# Patient Record
Sex: Male | Born: 1955 | Race: White | Hispanic: No | Marital: Married | Smoking: Former smoker
Health system: Southern US, Community
[De-identification: ages and names within clinical notes are randomized; demographics above are authoritative.]

## PROBLEM LIST (undated history)

## (undated) DIAGNOSIS — I2510 Atherosclerotic heart disease of native coronary artery without angina pectoris: Secondary | ICD-10-CM

## (undated) DIAGNOSIS — I6523 Occlusion and stenosis of bilateral carotid arteries: Secondary | ICD-10-CM

## (undated) DIAGNOSIS — K219 Gastro-esophageal reflux disease without esophagitis: Secondary | ICD-10-CM

## (undated) DIAGNOSIS — I6529 Occlusion and stenosis of unspecified carotid artery: Secondary | ICD-10-CM

## (undated) DIAGNOSIS — I495 Sick sinus syndrome: Secondary | ICD-10-CM

## (undated) DIAGNOSIS — K269 Duodenal ulcer, unspecified as acute or chronic, without hemorrhage or perforation: Secondary | ICD-10-CM

## (undated) DIAGNOSIS — I10 Essential (primary) hypertension: Secondary | ICD-10-CM

---

## 2011-12-06 DIAGNOSIS — K750 Abscess of liver: Secondary | ICD-10-CM | POA: Insufficient documentation

## 2012-03-21 DIAGNOSIS — K219 Gastro-esophageal reflux disease without esophagitis: Secondary | ICD-10-CM

## 2012-03-21 DIAGNOSIS — E119 Type 2 diabetes mellitus without complications: Secondary | ICD-10-CM | POA: Insufficient documentation

## 2012-03-21 DIAGNOSIS — I2510 Atherosclerotic heart disease of native coronary artery without angina pectoris: Secondary | ICD-10-CM

## 2012-03-21 DIAGNOSIS — I10 Essential (primary) hypertension: Secondary | ICD-10-CM

## 2012-03-21 HISTORY — DX: Essential (primary) hypertension: I10

## 2012-03-21 HISTORY — DX: Gastro-esophageal reflux disease without esophagitis: K21.9

## 2012-03-21 HISTORY — DX: Atherosclerotic heart disease of native coronary artery without angina pectoris: I25.10

## 2014-08-02 DIAGNOSIS — K269 Duodenal ulcer, unspecified as acute or chronic, without hemorrhage or perforation: Secondary | ICD-10-CM

## 2014-08-02 HISTORY — DX: Duodenal ulcer, unspecified as acute or chronic, without hemorrhage or perforation: K26.9

## 2015-04-09 DIAGNOSIS — E785 Hyperlipidemia, unspecified: Secondary | ICD-10-CM | POA: Insufficient documentation

## 2015-05-02 DIAGNOSIS — I6529 Occlusion and stenosis of unspecified carotid artery: Secondary | ICD-10-CM

## 2015-05-02 DIAGNOSIS — E785 Hyperlipidemia, unspecified: Secondary | ICD-10-CM | POA: Insufficient documentation

## 2015-05-02 DIAGNOSIS — E119 Type 2 diabetes mellitus without complications: Secondary | ICD-10-CM

## 2015-05-02 HISTORY — DX: Occlusion and stenosis of unspecified carotid artery: I65.29

## 2015-05-22 DIAGNOSIS — I495 Sick sinus syndrome: Secondary | ICD-10-CM

## 2015-05-22 HISTORY — DX: Sick sinus syndrome: I49.5

## 2015-12-30 DIAGNOSIS — I6523 Occlusion and stenosis of bilateral carotid arteries: Secondary | ICD-10-CM

## 2015-12-30 HISTORY — DX: Occlusion and stenosis of bilateral carotid arteries: I65.23

## 2018-03-09 DIAGNOSIS — Z45018 Encounter for adjustment and management of other part of cardiac pacemaker: Secondary | ICD-10-CM

## 2018-07-30 ENCOUNTER — Other Ambulatory Visit: Payer: Self-pay

## 2018-07-30 ENCOUNTER — Encounter (HOSPITAL_COMMUNITY): Payer: Self-pay | Admitting: Internal Medicine

## 2018-07-30 ENCOUNTER — Inpatient Hospital Stay (HOSPITAL_COMMUNITY)
Admission: AD | Admit: 2018-07-30 | Discharge: 2018-08-03 | DRG: 064 | Disposition: A | Discharge status: Home Health | Payer: BLUE CROSS/BLUE SHIELD | Source: Other Acute Inpatient Hospital | Attending: Family Medicine | Admitting: Family Medicine

## 2018-07-30 ENCOUNTER — Inpatient Hospital Stay (HOSPITAL_COMMUNITY): Payer: BLUE CROSS/BLUE SHIELD

## 2018-07-30 DIAGNOSIS — K922 Gastrointestinal hemorrhage, unspecified: Secondary | ICD-10-CM | POA: Diagnosis present

## 2018-07-30 DIAGNOSIS — Z794 Long term (current) use of insulin: Secondary | ICD-10-CM

## 2018-07-30 DIAGNOSIS — E785 Hyperlipidemia, unspecified: Secondary | ICD-10-CM | POA: Diagnosis present

## 2018-07-30 DIAGNOSIS — R29810 Facial weakness: Secondary | ICD-10-CM | POA: Diagnosis present

## 2018-07-30 DIAGNOSIS — I495 Sick sinus syndrome: Secondary | ICD-10-CM | POA: Diagnosis present

## 2018-07-30 DIAGNOSIS — K269 Duodenal ulcer, unspecified as acute or chronic, without hemorrhage or perforation: Secondary | ICD-10-CM | POA: Diagnosis not present

## 2018-07-30 DIAGNOSIS — Z6828 Body mass index (BMI) 28.0-28.9, adult: Secondary | ICD-10-CM | POA: Diagnosis not present

## 2018-07-30 DIAGNOSIS — D62 Acute posthemorrhagic anemia: Secondary | ICD-10-CM | POA: Diagnosis present

## 2018-07-30 DIAGNOSIS — Z885 Allergy status to narcotic agent status: Secondary | ICD-10-CM

## 2018-07-30 DIAGNOSIS — K2971 Gastritis, unspecified, with bleeding: Secondary | ICD-10-CM | POA: Diagnosis present

## 2018-07-30 DIAGNOSIS — I6523 Occlusion and stenosis of bilateral carotid arteries: Secondary | ICD-10-CM | POA: Diagnosis present

## 2018-07-30 DIAGNOSIS — Z955 Presence of coronary angioplasty implant and graft: Secondary | ICD-10-CM

## 2018-07-30 DIAGNOSIS — I639 Cerebral infarction, unspecified: Secondary | ICD-10-CM | POA: Diagnosis present

## 2018-07-30 DIAGNOSIS — I10 Essential (primary) hypertension: Secondary | ICD-10-CM | POA: Diagnosis present

## 2018-07-30 DIAGNOSIS — Z951 Presence of aortocoronary bypass graft: Secondary | ICD-10-CM

## 2018-07-30 DIAGNOSIS — Z79899 Other long term (current) drug therapy: Secondary | ICD-10-CM | POA: Diagnosis not present

## 2018-07-30 DIAGNOSIS — I6330 Cerebral infarction due to thrombosis of unspecified cerebral artery: Secondary | ICD-10-CM | POA: Diagnosis present

## 2018-07-30 DIAGNOSIS — E1140 Type 2 diabetes mellitus with diabetic neuropathy, unspecified: Secondary | ICD-10-CM | POA: Diagnosis present

## 2018-07-30 DIAGNOSIS — Z45018 Encounter for adjustment and management of other part of cardiac pacemaker: Secondary | ICD-10-CM

## 2018-07-30 DIAGNOSIS — E663 Overweight: Secondary | ICD-10-CM | POA: Diagnosis present

## 2018-07-30 DIAGNOSIS — K5900 Constipation, unspecified: Secondary | ICD-10-CM | POA: Diagnosis not present

## 2018-07-30 DIAGNOSIS — I2510 Atherosclerotic heart disease of native coronary artery without angina pectoris: Secondary | ICD-10-CM | POA: Diagnosis present

## 2018-07-30 DIAGNOSIS — K210 Gastro-esophageal reflux disease with esophagitis: Secondary | ICD-10-CM | POA: Diagnosis present

## 2018-07-30 DIAGNOSIS — G4700 Insomnia, unspecified: Secondary | ICD-10-CM | POA: Diagnosis present

## 2018-07-30 DIAGNOSIS — K264 Chronic or unspecified duodenal ulcer with hemorrhage: Secondary | ICD-10-CM | POA: Diagnosis present

## 2018-07-30 DIAGNOSIS — R55 Syncope and collapse: Secondary | ICD-10-CM | POA: Diagnosis not present

## 2018-07-30 DIAGNOSIS — Z7982 Long term (current) use of aspirin: Secondary | ICD-10-CM

## 2018-07-30 DIAGNOSIS — Z950 Presence of cardiac pacemaker: Secondary | ICD-10-CM | POA: Diagnosis not present

## 2018-07-30 DIAGNOSIS — E1169 Type 2 diabetes mellitus with other specified complication: Secondary | ICD-10-CM

## 2018-07-30 DIAGNOSIS — Z87891 Personal history of nicotine dependence: Secondary | ICD-10-CM | POA: Diagnosis not present

## 2018-07-30 DIAGNOSIS — E119 Type 2 diabetes mellitus without complications: Secondary | ICD-10-CM

## 2018-07-30 HISTORY — DX: Occlusion and stenosis of bilateral carotid arteries: I65.23

## 2018-07-30 HISTORY — DX: Essential (primary) hypertension: I10

## 2018-07-30 HISTORY — DX: Duodenal ulcer, unspecified as acute or chronic, without hemorrhage or perforation: K26.9

## 2018-07-30 HISTORY — DX: Atherosclerotic heart disease of native coronary artery without angina pectoris: I25.10

## 2018-07-30 HISTORY — DX: Gastro-esophageal reflux disease without esophagitis: K21.9

## 2018-07-30 HISTORY — DX: Occlusion and stenosis of unspecified carotid artery: I65.29

## 2018-07-30 HISTORY — DX: Sick sinus syndrome: I49.5

## 2018-07-30 LAB — COMPREHENSIVE METABOLIC PANEL
ALT: 12 U/L (ref 0–44)
AST: 15 U/L (ref 15–41)
Albumin: 2.7 g/dL — ABNORMAL LOW (ref 3.5–5.0)
Alkaline Phosphatase: 42 U/L (ref 38–126)
Anion gap: 13 (ref 5–15)
BUN: 14 mg/dL (ref 8–23)
CO2: 24 mmol/L (ref 22–32)
Calcium: 8.3 mg/dL — ABNORMAL LOW (ref 8.9–10.3)
Chloride: 98 mmol/L (ref 98–111)
Creatinine, Ser: 0.78 mg/dL (ref 0.61–1.24)
GFR calc Af Amer: >60 mL/min (ref >60)
GFR calc non Af Amer: >60 mL/min (ref >60)
Glucose, Bld: 226 mg/dL — ABNORMAL HIGH (ref 70–99)
Potassium: 4 mmol/L (ref 3.5–5.1)
Sodium: 135 mmol/L (ref 135–145)
Total Bilirubin: 1 mg/dL (ref 0.3–1.2)
Total Protein: 5.5 g/dL — ABNORMAL LOW (ref 6.5–8.1)

## 2018-07-30 LAB — BRAIN NATRIURETIC PEPTIDE: B Natriuretic Peptide: 109.4 pg/mL — ABNORMAL HIGH (ref 0.0–100.0)

## 2018-07-30 LAB — CBC WITH DIFFERENTIAL/PLATELET
Abs Immature Granulocytes: 0.23 10*3/uL — ABNORMAL HIGH (ref 0.00–0.07)
BASOS PCT: 0 %
Basophils Absolute: 0 10*3/uL (ref 0.0–0.1)
Eosinophils Absolute: 0.2 10*3/uL (ref 0.0–0.5)
Eosinophils Relative: 2 %
HCT: 24.2 % — ABNORMAL LOW (ref 39.0–52.0)
Hemoglobin: 7.3 g/dL — ABNORMAL LOW (ref 13.0–17.0)
Immature Granulocytes: 2 %
Lymphocytes Relative: 18 %
Lymphs Abs: 1.9 10*3/uL (ref 0.7–4.0)
MCH: 27 pg (ref 26.0–34.0)
MCHC: 30.2 g/dL (ref 30.0–36.0)
MCV: 89.6 fL (ref 80.0–100.0)
Monocytes Absolute: 1 10*3/uL (ref 0.1–1.0)
Monocytes Relative: 10 %
Neutro Abs: 7 10*3/uL (ref 1.7–7.7)
Neutrophils Relative %: 68 %
Platelets: 293 10*3/uL (ref 150–400)
RBC: 2.7 MIL/uL — ABNORMAL LOW (ref 4.22–5.81)
RDW: 13.8 % (ref 11.5–15.5)
WBC: 10.4 10*3/uL (ref 4.0–10.5)
nRBC: 0.3 % — ABNORMAL HIGH (ref 0.0–0.2)

## 2018-07-30 LAB — GLUCOSE, CAPILLARY
Glucose-Capillary: 210 mg/dL — ABNORMAL HIGH (ref 70–99)
Glucose-Capillary: 262 mg/dL — ABNORMAL HIGH (ref 70–99)

## 2018-07-30 LAB — MAGNESIUM: Magnesium: 1.7 mg/dL (ref 1.7–2.4)

## 2018-07-30 MED ORDER — SIMVASTATIN 20 MG PO TABS
40.0000 mg | ORAL_TABLET | Freq: Every day | ORAL | Status: DC
  Administered 2018-08-01 – 2018-08-03 (×3): 40 mg via ORAL
  Filled 2018-07-30 (×3): qty 2

## 2018-07-30 MED ORDER — INSULIN GLARGINE 100 UNIT/ML ~~LOC~~ SOLN
10.0000 [IU] | Freq: Every day | SUBCUTANEOUS | Status: DC
  Administered 2018-07-30 – 2018-08-01 (×3): 10 [IU] via SUBCUTANEOUS
  Filled 2018-07-30 (×4): qty 0.1

## 2018-07-30 MED ORDER — TEMAZEPAM 7.5 MG PO CAPS
15.0000 mg | ORAL_CAPSULE | Freq: Every evening | ORAL | Status: DC | PRN
  Administered 2018-07-30 – 2018-08-01 (×3): 15 mg via ORAL
  Filled 2018-07-30 (×3): qty 2

## 2018-07-30 MED ORDER — SODIUM CHLORIDE 0.9 % IV SOLN
8.0000 mg/h | INTRAVENOUS | Status: AC
  Administered 2018-07-30 – 2018-08-02 (×6): 8 mg/h via INTRAVENOUS
  Filled 2018-07-30 (×9): qty 80

## 2018-07-30 MED ORDER — PANTOPRAZOLE SODIUM 40 MG IV SOLR
40.0000 mg | Freq: Two times a day (BID) | INTRAVENOUS | Status: DC
  Administered 2018-08-03: 40 mg via INTRAVENOUS
  Filled 2018-07-30: qty 40

## 2018-07-30 MED ORDER — CLONIDINE HCL 0.1 MG PO TABS
0.1000 mg | ORAL_TABLET | Freq: Two times a day (BID) | ORAL | Status: DC
  Administered 2018-07-30 – 2018-08-03 (×8): 0.1 mg via ORAL
  Filled 2018-07-30 (×8): qty 1

## 2018-07-30 MED ORDER — INSULIN ASPART 100 UNIT/ML ~~LOC~~ SOLN
0.0000 [IU] | Freq: Three times a day (TID) | SUBCUTANEOUS | Status: DC
  Administered 2018-07-31: 3 [IU] via SUBCUTANEOUS
  Administered 2018-07-31: 2 [IU] via SUBCUTANEOUS
  Administered 2018-07-31 – 2018-08-01 (×2): 3 [IU] via SUBCUTANEOUS
  Administered 2018-08-01: 2 [IU] via SUBCUTANEOUS
  Administered 2018-08-02: 3 [IU] via SUBCUTANEOUS
  Administered 2018-08-02: 2 [IU] via SUBCUTANEOUS
  Administered 2018-08-02: 3 [IU] via SUBCUTANEOUS
  Administered 2018-08-03: 1 [IU] via SUBCUTANEOUS
  Administered 2018-08-03 (×2): 3 [IU] via SUBCUTANEOUS

## 2018-07-30 MED ORDER — GABAPENTIN 600 MG PO TABS
300.0000 mg | ORAL_TABLET | Freq: Every day | ORAL | Status: DC
  Administered 2018-07-30 – 2018-08-02 (×4): 300 mg via ORAL
  Filled 2018-07-30 (×4): qty 1

## 2018-07-30 NOTE — Care Management (Signed)
This is a no charge note  Pending admission per Dr.   Atlee [facility]fer [facility] per Dr. Ebbie LatusSikorski  62 year old male with past medical history of hypertension, GERD, CAD, CABG, pacemaker placement, duodenal ulcer, who presents with rectal bleeding and generalized weakness.  Patient had 2 syncope episodes in the past several days.  Found to have hemoglobin dropped from 14.1 on 05/2017 to 7.7 today.  Patient is going to be transfused with 1 unit of blood.  Protonix drip is started.   Patient was found to have negative troponin, proBNP 435, blood pressure 200/86--> 162/72, heart rate 74, RR 19, oxygen saturation 96% on room air, no fever.  Chest x-ray negative.  CT head is negative for acute intracranial abnormalities.  Patient is accepted to telemetry bed as inpatient.  Since patient is hemodynamically stable, hemoglobin is 7.7, no active bleeding currently.  I did not request GI consult tonight.  Please call GI at admission.  I told EDP that although we are accepting the patient, yet we do not have bed available currently. Patient may not be able to get here in a timely fashion. I encouraged him to continue looking for a facility that had beds.   Please call manager of [facility] at [phone] when pt arrives to floor   Lorretta HarpXilin Kalief Kattner, MD  [facility] Pager [phone]  If 7PM-7AM, please contact night-coverage [email or web address] Password [email or web address] 07/30/2018, 1:41 AM

## 2018-07-30 NOTE — Progress Notes (Signed)
Dakota Morton [id number] Admission Data: 07/30/2018 7:40 PM Attending Provider: Ike BenePark, Carolyn J, MD  WUJ:[id number]:System, Pcp Not In Consults/ Treatment Team: Treatment Team:  Charna ElizabethMann, Jyothi, MD  Dakota Morton is a 62 y.o. male patient [facility], alert  & orientated  X 4,  Full Code, VSS - Blood pressure (!) 168/65, pulse 80, temperature 99.5 F (37.5 C), temperature source Oral, resp. rate 18, height 5\' 9"  (1.753 m), weight 86 kg, SpO2 98 %., O2  RA, no c/o shortness of breath, no c/o chest pain, no distress noted. Tele # 14 placed and pt is currently running: NSR   IV site WDL:  LAC and RFA with a transparent dsg that's clean dry and intact.  Allergies:   Allergies  Allergen Reactions  . Codeine Nausea Only     Pt orientation to unit, room and routine. Information packet given to patient/family and safety video watched.  Admission INP armband ID verified with patient/family, and in place. SR up x 2, fall risk assessment complete with Patient and family verbalizing understanding of risks associated with falls. Pt verbalizes an understanding of how to use the call bell and to call for help before getting out of bed.  Skin, clean-dry- intact without evidence of bruising, or skin tears.   No evidence of skin break down noted on exam.  Will cont to monitor and assist as needed.  Tacey HeapElisha Epperson, RN 07/30/2018 7:40 PM

## 2018-07-30 NOTE — H&P (Signed)
History and Physical  Dakota Morton Arvanitis [id number] DOB: May 06, 1956 DOA: 07/30/2018 1746  Referring physician: transfer from [facility] (accepted by Dr. Clyde LundborgNiu) PCP: System, Pcp Not In  Outpatient Specialists: Dr. Chales AbrahamsGupta (GI - [place])  HISTORY   Chief Complaint: tarry stools and syncope  HPI: Dakota Morton is a 62 y.o. male with CAD s/p CABG, hx of sick sinus syncrome s/p PPM, bilateral carotid artery stenosis s/p R carotid stent, HTN, and prior hx of duodenal ulcer s/p IR GDA embolization (2016) who presents with tarry stools and near syncope that started 3 days ago. Patient reports that on Friday (3 days prior to this admission), he had an episode of black stools x 2 (pasty and black-colored). Then later that day and early on Saturday, patient had one episode of near fainting while ambulating and then another episode of syncope where he was found slumped against the door by his wife. His wife also noted that immediately after he was found down, patient had a noticeable L sided lower facial droop. Patient also recalls having increased L hand grip clumsiness around the time of his syncope. Denies head trauma although fall was not witnessed -- thought that patient had slid against the doorframe. [facility] given his symptoms. Hb found to 7.7 at [facility] (baseline 13-14); patient was transfused 1 unit pRBC and started on PPI. [facility] anticipated GI procedure -- due to bed availability, arrived on 07/30/18 evening.    Patient denies recent NSAID use; only takes baby aspirin daily (as prescribed). No chest pain, URI symptoms, UTI symptoms, fevers/chills. Last BM was late Friday (black and pasty stools). Has not had a BM since Friday. He reports that for the past few months, he has noticed an occasional gnawing discomfort and bloating/gas like symptoms with associated nausea post-prandially in his upper epigastric area --  patient reports relief with alka-seltzers. Currently feels mildly nauseated.   Review of Systems:  + tarry stools; + nausea + near syncope and actual syncope and + transient L facial droop on Friday and L hand clumsiness (resolved)  - no fevers/chills - no cough - no chest pain, dyspnea on exertion - no edema, PND, orthopnea - no dysuria, increased urinary frequency - no weight changes  Rest of systems reviewed are negative, except as per above history.   ED course:  Vitals Blood pressure (!) 168/65, pulse 80, temperature 99.5 F (37.5 C), temperature source Oral, resp. rate 18, height 5\' 9"  (1.753 m), weight 86 kg, SpO2 98 %. Prior to transfer ([facility]) reported 1 pRBC x 1 unit and PPI 40mg  bolus and infusion started.   Past Medical History:  Diagnosis Date  . Atherosclerotic heart disease of native coronary artery without angina pectoris 03/21/2012   Stent 2003 Stent 2005 B TCA to right coronary artery 2003 as well as in April 2015  . Bilateral carotid artery stenosis 12/30/2015  . Carotid stenosis 05/02/2015  . Duodenal ulcer 2016  . Essential hypertension 03/21/2012  . GERD (gastroesophageal reflux disease) 03/21/2012  . SSS (sick sinus syndrome) (HCC) 05/22/2015   Surgical history: CABG, R carotid artery stent; duodenal ulcer s/p IR embolization (2016); Hernia surgery  Social History:  reports that he has quit smoking. He quit after 25.00 years of use. He has never used smokeless tobacco. He reports previous alcohol use. He reports that he does not use drugs.  Allergies  Allergen Reactions  . Codeine Nausea Only  No family history on file.    Prior to Admission medications   Medication Sig Start Date End Date Taking? Authorizing Provider  amLODipine (NORVASC) 10 MG tablet Take 10 mg by mouth. 06/19/18  Yes [provider]  aspirin EC 81 MG tablet Take 81 mg by mouth daily.   Yes [provider]  benazepril (LOTENSIN) 40 MG tablet Take 40 mg by  mouth. 06/19/18  Yes [provider]  fenofibrate 160 MG tablet Take by mouth. 04/01/17  Yes [provider]  furosemide (LASIX) 20 MG tablet Take by mouth. 04/24/18  Yes [provider]  insulin NPH Human (HUMULIN N,NOVOLIN N) 100 UNIT/ML injection  03/24/17  Yes [provider]  metFORMIN (GLUCOPHAGE) 1000 MG tablet Take by mouth. 10/21/15  Yes [provider]  metoprolol tartrate (LOPRESSOR) 100 MG tablet Take 50 mg by mouth 2 (two) times daily. 02/14/18  Yes [provider]  simvastatin (ZOCOR) 40 MG tablet Take by mouth. 03/09/18  Yes [provider]  temazepam (RESTORIL) 15 MG capsule Take by mouth. 08/20/15  Yes [provider]  Gabapentin, Once-Daily, 300 MG TABS Take 300 mg by mouth daily.    [provider]  insulin detemir (LEVEMIR) 100 UNIT/ML injection Inject into the skin.    [provider]     PHYSICAL EXAM   Temp:  [99.5 F (37.5 C)] 99.5 F (37.5 C) (12/29 1813) Pulse Rate:  [80] 80 (12/29 1813) Resp:  [18] 18 (12/29 1813) BP: (168)/(65) 168/65 (12/29 1813) SpO2:  [98 %] 98 % (12/29 1813) Weight:  [86 kg] 86 kg (12/29 1813)  BP (!) 168/65 (BP Location: Left Arm)   Pulse 80   Temp 99.5 F (37.5 C) (Oral)   Resp 18   Ht 5\' 9"  (1.753 m)   Wt 86 kg   SpO2 98%   BMI 27.98 kg/m    GEN obese elderly caucasian male; not in acute distress; tearful during conversation; observed ambulating from bathroom without issue  HEENT NCAT EOM intact PERRL; clear oropharynx, no cervical LAD; moist mucus membranes  JVP estimated 5 cm H2O above RA; no HJR ; no carotid bruits b/l ;  CV regular normal rate; normal S1 and S2; no m/r/g or S3/S4; PMI non displaced; no parasternal heave;  Well-healed sternotomy scar; well healed PPM site L upper chest without hematoma RESP CTA b/l; breathing unlabored and symmetric  ABD soft NT ND +normoactive BS  EXT warm throughout b/l; no peripheral edema b/l  PULSES   DP 2+ intact b/l  SKIN/MSK no rashes or lesions  NEURO/PSYCH AAOx4;  no facial droop; intact 5/5 hand grip b/l; pupils equal and reactive; no visual field deficits; no sensory deficits; lower extremity motor strength symmetric and 5/5 b/l   DATA   LABS ON ADMISSION:  Basic Metabolic Panel: No results for input(s): NA, K, CL, CO2, GLUCOSE, BUN, CREATININE, CALCIUM, MG, PHOS in the last 168 hours. CBC: Recent Labs  Lab 07/30/18 1851  WBC 10.4  NEUTROABS 7.0  HGB 7.3*  HCT 24.2*  MCV 89.6  PLT 293   Liver Function Tests: No results for input(s): AST, ALT, ALKPHOS, BILITOT, PROT, ALBUMIN in the last 168 hours. No results for input(s): LIPASE, AMYLASE in the last 168 hours. No results for input(s): AMMONIA in the last 168 hours. Coagulation:  No results found for: INR, PROTIME No results found for: PTT Lactic Acid, Venous:  No results found for: LATICACIDVEN Cardiac Enzymes: No results for input(s): CKTOTAL,  CKMB, CKMBINDEX, TROPONINI in the last 168 hours. Urinalysis: No results found for: COLORURINE, APPEARANCEUR, LABSPEC, PHURINE, GLUCOSEU, HGBUR, BILIRUBINUR, KETONESUR, PROTEINUR, UROBILINOGEN, NITRITE, LEUKOCYTESUR  BNP (last 3 results) No results for input(s): PROBNP in the last 8760 hours. CBG: Recent Labs  Lab 07/30/18 1806  GLUCAP 210*    Radiological Exams on Admission: No results found.  EKG: Independently reviewed. NSR with nonspecific ST and T wave inversions in infero and lateral precordial leads  I have reviewed the patient's previous electronic chart records, labs, and other data.   ASSESSMENT AND PLAN   Assessment: Dakota Morton is a 62 y.o. male with CAD s/p CABG, hx of sick sinus syncrome s/p PPM, bilateral carotid artery stenosis s/p R carotid stent, HTN, and prior hx of duodenal ulcer s/p IR GDA embolization (2016) who presents with tarry stools and near syncope + orthostatic syncope x 3 days and [facility].  Symptoms concerning for UGIB. Per [facility] report, patient's Hb 7.7 (baseline at 13-14) and received pRBC x 1 unit, started on protonix drip. Given patient's subacute symptoms of postprandial nausea for past few months, he may have had a worsening ulcer prior to admission. No other triggers ie NSAID use; unknown H pylori history. Also concerning after his second syncope event that occurred about 2 days prior to admission is wife's report of transient facial droop and L hand clumsiness (resolved). Priority will be for upper GI bleed evaluation as per GI but we will also plan for CT head overnight, and consider further neuro workup after anticipated EGD.    Active Problems:   GI bleed  Plan:   # Suspected UGIB with prior hx of duodenal ulcer s/p GDA embolization by IR in 2016 (Care Everywhere IR report under Other Results). > [facility] s/p 1 pRBC; repeat Hb here 7.3; hemodynamically stable - type and screen; maintain 2 large bore PIVs - GI aware; will decide on possible scope either for 12/30 or 12/31 tentatively - repeat Hb pending in AM, unless patient has recurrent melena/tarry stool - continue protonix drip - transfuse goal Hb > 7  - prn fluid bolus as needed for hypotension - NPO after midnight - hold aspirin at this time  # Concern for TIA after second syncope. Known bilateral carotid artery disease with R carotid stent. No focal neuro symptoms at this time.  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 [facility] showed EF 55-60% with mild apical hypokinesis.  > no angina;  - EKG pending - holding aspirin given GI bleed - may resume statin post procedure  # Hx of diabetic neuropathy and insomnia  - resume home restoril as needed prn sleep  - gabapentin 300mg  nightly resume  DVT Prophylaxis: SCDs (no pharmacological given high bleeding risk) Code Status:  Full Code Family Communication: discussed with patient and family at bedside  Disposition Plan: admit to floor (medical telemetry) pending GI evaluation   Patient contact: Extended Emergency Contact Information Primary Emergency Contact: Jennifer, [place] Mobile Phone: [phone] Relation: None Interpreter needed? No Secondary Emergency Contact: Tomasz, [place] Mobile Phone: [phone] Relation: None Interpreter needed? No  Time spent: > 35 mins  Ike Bene, MD [facility] Pager [phone]  If 7PM-7AM, please contact night-coverage [email or web address] Password [facility] 07/30/2018, 7:49 PM

## 2018-07-31 DIAGNOSIS — K269 Duodenal ulcer, unspecified as acute or chronic, without hemorrhage or perforation: Secondary | ICD-10-CM

## 2018-07-31 DIAGNOSIS — D62 Acute posthemorrhagic anemia: Secondary | ICD-10-CM

## 2018-07-31 LAB — PREPARE RBC (CROSSMATCH)

## 2018-07-31 LAB — BASIC METABOLIC PANEL
Anion gap: 12 (ref 5–15)
BUN: 12 mg/dL (ref 8–23)
CO2: 26 mmol/L (ref 22–32)
CREATININE: 0.76 mg/dL (ref 0.61–1.24)
Calcium: 8.8 mg/dL — ABNORMAL LOW (ref 8.9–10.3)
Chloride: 99 mmol/L (ref 98–111)
GFR calc Af Amer: >60 mL/min (ref >60)
GFR calc non Af Amer: >60 mL/min (ref >60)
Glucose, Bld: 247 mg/dL — ABNORMAL HIGH (ref 70–99)
Potassium: 4.2 mmol/L (ref 3.5–5.1)
Sodium: 137 mmol/L (ref 135–145)

## 2018-07-31 LAB — GLUCOSE, CAPILLARY
Glucose-Capillary: 190 mg/dL — ABNORMAL HIGH (ref 70–99)
Glucose-Capillary: 226 mg/dL — ABNORMAL HIGH (ref 70–99)
Glucose-Capillary: 227 mg/dL — ABNORMAL HIGH (ref 70–99)
Glucose-Capillary: 249 mg/dL — ABNORMAL HIGH (ref 70–99)
Glucose-Capillary: 257 mg/dL — ABNORMAL HIGH (ref 70–99)
Glucose-Capillary: 273 mg/dL — ABNORMAL HIGH (ref 70–99)

## 2018-07-31 LAB — CBC
HCT: 24.9 % — ABNORMAL LOW (ref 39.0–52.0)
HEMOGLOBIN: 7.6 g/dL — AB (ref 13.0–17.0)
MCH: 27.5 pg (ref 26.0–34.0)
MCHC: 30.5 g/dL (ref 30.0–36.0)
MCV: 90.2 fL (ref 80.0–100.0)
Platelets: 315 10*3/uL (ref 150–400)
RBC: 2.76 MIL/uL — ABNORMAL LOW (ref 4.22–5.81)
RDW: 13.9 % (ref 11.5–15.5)
WBC: 9.7 10*3/uL (ref 4.0–10.5)
nRBC: 0.2 % (ref 0.0–0.2)

## 2018-07-31 LAB — ABO/RH: ABO/RH(D): O POS

## 2018-07-31 MED ORDER — ACETAMINOPHEN 325 MG PO TABS
650.0000 mg | ORAL_TABLET | Freq: Four times a day (QID) | ORAL | Status: DC | PRN
  Administered 2018-07-31 – 2018-08-03 (×4): 650 mg via ORAL
  Filled 2018-07-31 (×4): qty 2

## 2018-07-31 MED ORDER — FUROSEMIDE 10 MG/ML IJ SOLN
40.0000 mg | Freq: Once | INTRAMUSCULAR | Status: AC
  Administered 2018-07-31: 40 mg via INTRAVENOUS
  Filled 2018-07-31: qty 4

## 2018-07-31 MED ORDER — SODIUM CHLORIDE 0.9% IV SOLUTION
Freq: Once | INTRAVENOUS | Status: AC
  Administered 2018-07-31: 13:00:00 via INTRAVENOUS

## 2018-07-31 NOTE — Consult Note (Addendum)
Referring Provider: [facility]   Primary Care Physician:  System, Pcp Not In Primary Gastroenterologist:   He says Dr. Chales Abrahams     Reason for Consultation:   GI bleed    ASSESSMENT / PLAN:     62 yo male with multiple medical problems as listed below, transferred from [facility] with melena / acute anemia / syncope. He has a hx of UGI bleeding from duodenal ulcer , s/p GDA embolization at [facility] in 2016.  -Patient will need EGD. Would like for him to get additional pRBC since hgb still in mid 7 range after the one unit at [facility] will order blood now.  -Keep NPO as we may be able to proceed with EGD later today. The risks and benefits of EGD were discussed and the patient agrees to proceed.  -continue PPI gtt  ADDENDUM: spoke with Hospitalist. Patient hasn't got pRBC yet. Will feed him and plan for EGD tomorrow.   HPI:      HPI: Dakota Morton is a 62 y.o. male with a complex medical history not limited to DM, CAD/ CABG, SSS s/p PPM, bilateral carotid artery stenosis, s/p R stent, HTN, pancreatic tail lesion evaluated by EUS in 2013 at [facility] and felt to be benign. He also has a hx of a liver abscess 2013 s/p drainage, also at [facility] in 2013. CABG in 2016 was complicated by UGI bleed from duodenal ulcer while on plavix for carotid stent. He is s/p IR GDA embolization.   On Thursday or Friday patient says he passed out twice. At that point he had not had any overt GI bleeding, just some nausea. He did finally have evidence for GI bleeding after taking an enema after which time he had two black stools. No associated abdominal pain, weight is down ~ 10 pounds recently. He doesn't take blood thinners, just a daily baby ASA and no other NSAIDS.  Patient was taken to [facility], hgb found to be to 7.7 at [place] (baseline 13-14). He was transfused one unit of pRBC, started on PPI and transfer on to [facility] on yesterday. Hgb remains in mid 7 range. BUN normal. He  hasn't had any further BMs / bleeding.     Colonoscopy [facility] OPERATIVE PROCEDURE REPORT - 12/08/2011  PATIENT:         [name].   MR #:    [id number] BIRTHDATE:    [birth year]   GENDER:    Male ATTENDING:    Darolyn Rua M.D.   REFERRING MD: FELLOW:      Rudean Hitt M.D.   ASSISTANT:    Ines Bloomer Alexander GIT and Neta Mends R.N.   PROCEDURE:   1. diagnostic colonoscopy 2. colonoscopy and biopsy  INDICATIONS:   1. Unexplained diarrhea  MEDICATIONS:   1. Fentanyl 75 mcg IV 2. Versed 5 mg IV  DESCRIPTION OF PROCEDURE: After the risks benefits and alternatives of the procedure were thoroughly explained, Informed consent was obtained. A digital rectal exam was performed and revealed no abnormalities of the rectum. The [id number] endoscope was introduced through the anus and advanced to the terminal ileum which was intubated for a short distance. The prep was good. The instrument was then slowly withdrawn as the colon was fully examined.   The mucosa appeared normal Retroflexed views revealed no abnormalities. Multiple random biopsies were taken from throughout the colon for pathology. The scope was then completely withdrawn from the patient and the procedure terminated.  LIMITATIONS:  without limitations COMPLICATIONS:   There were no complications.  ENDOSCOPIC IMPRESSION:   1. Normal mucosa 2. Random biopsies obtained in setting diarrhea   RECOMMENDATION:   Await biopsy results Follow up with primary team  PATHOLOGY:   Pathology - Biopsy Taken     Past Medical History:  Diagnosis Date  . Atherosclerotic heart disease of native coronary artery without angina pectoris 03/21/2012   Stent 2003 Stent 2005 B TCA to right coronary artery 2003 as well as in April 2015  . Bilateral carotid artery stenosis 12/30/2015  . Carotid stenosis 05/02/2015  . Duodenal ulcer 2016  . Essential hypertension  03/21/2012  . GERD (gastroesophageal reflux disease) 03/21/2012  . SSS (sick sinus syndrome) (HCC) 05/22/2015   PMH: CABG   Prior to Admission medications   Medication Sig Start Date End Date Taking? Authorizing Provider  amLODipine (NORVASC) 10 MG tablet Take 10 mg by mouth. 06/19/18  Yes [provider]  aspirin EC 81 MG tablet Take 81 mg by mouth daily.   Yes [provider]  benazepril (LOTENSIN) 40 MG tablet Take 40 mg by mouth. 06/19/18  Yes [provider]  fenofibrate 160 MG tablet Take by mouth. 04/01/17  Yes [provider]  furosemide (LASIX) 20 MG tablet Take by mouth. 04/24/18  Yes [provider]  insulin NPH Human (HUMULIN N,NOVOLIN N) 100 UNIT/ML injection  03/24/17  Yes [provider]  metFORMIN (GLUCOPHAGE) 1000 MG tablet Take by mouth. 10/21/15  Yes [provider]  metoprolol tartrate (LOPRESSOR) 100 MG tablet Take 50 mg by mouth 2 (two) times daily. 02/14/18  Yes [provider]  simvastatin (ZOCOR) 40 MG tablet Take by mouth. 03/09/18  Yes [provider]  temazepam (RESTORIL) 15 MG capsule Take by mouth. 08/20/15  Yes [provider]  Gabapentin, Once-Daily, 300 MG TABS Take 300 mg by mouth daily.    [provider]  insulin detemir (LEVEMIR) 100 UNIT/ML injection Inject into the skin.    [provider]    Current Facility-Administered Medications  Medication Dose Route Frequency Provider Last Rate Last Dose  . cloNIDine (CATAPRES) tablet 0.1 mg  0.1 mg Oral BID Ike BenePark, Carolyn J, MD   0.1 mg at 07/31/18 0841  . gabapentin (NEURONTIN) tablet 300 mg  300 mg Oral QHS Ike BenePark, Carolyn J, MD   300 mg at 07/30/18 2231  . insulin aspart (novoLOG) injection 0-9 Units  0-9 Units Subcutaneous TID WC Ike BenePark, Carolyn J, MD   3 Units at 07/31/18 [phone]  . insulin glargine (LANTUS) injection 10 Units  10 Units Subcutaneous QHS Ike BenePark, Carolyn J, MD   10 Units at 07/30/18 2231  .  pantoprazole (PROTONIX) 80 mg in sodium chloride 0.9 % 250 mL (0.32 mg/mL) infusion  8 mg/hr Intravenous Continuous Ike BenePark, Carolyn J, MD 25 mL/hr at 07/31/18 0603 8 mg/hr at 07/31/18 0603  . [START ON 08/03/2018] pantoprazole (PROTONIX) injection 40 mg  40 mg Intravenous Q12H Park, Bartholome Billarolyn J, MD      . Melene Muller[START ON 08/01/2018] simvastatin (ZOCOR) tablet 40 mg  40 mg Oral q1800 Park, Bartholome Billarolyn J, MD      . temazepam (RESTORIL) capsule 15 mg  15 mg Oral QHS PRN Ike BenePark, Carolyn J, MD   15 mg at 07/30/18 2242    Allergies as of 07/29/2018  . (Not on File)    No family history on file.  Social History   Socioeconomic History  . Marital status: Married    Spouse name:  Not on file  . Number of children: Not on file  . Years of education: Not on file  . Highest education level: Not on file  Occupational History  . Not on file  Social Needs  . Financial resource strain: Not on file  . Food insecurity:    Worry: Not on file    Inability: Not on file  . Transportation needs:    Medical: Not on file    Non-medical: Not on file  Tobacco Use  . Smoking status: Former Smoker    Years: 25.00  . Smokeless tobacco: Never Used  Substance and Sexual Activity  . Alcohol use: Not Currently  . Drug use: Never  . Sexual activity: Not on file  Lifestyle  . Physical activity:    Days per week: Not on file    Minutes per session: Not on file  . Stress: Not on file  Relationships  . Social connections:    Talks on phone: Not on file    Gets together: Not on file    Attends religious service: Not on file    Active member of club or organization: Not on file    Attends meetings of clubs or organizations: Not on file    Relationship status: Not on file  . Intimate partner violence:    Fear of current or ex partner: Not on file    Emotionally abused: Not on file    Physically abused: Not on file    Forced sexual activity: Not on file  Other Topics Concern  . Not on file  Social History Narrative  .  Not on file    Review of Systems: All systems reviewed and negative except where noted in HPI.  Physical Exam: Vital signs in last 24 hours: Temp:  [98.1 F (36.7 C)-100 F (37.8 C)] 98.3 F (36.8 C) (12/30 0454) Pulse Rate:  [74-83] 78 (12/30 0454) Resp:  [17-22] 17 (12/30 0454) BP: (158-171)/(65-73) 158/68 (12/30 0454) SpO2:  [96 %-99 %] 99 % (12/30 0454) Weight:  [86 kg] 86 kg (12/29 1813) Last BM Date: 07/28/18 General:   Alert, well-developed,  white male in NAD Psych:  Pleasant, cooperative. Normal mood and affect. Eyes:  Pupils equal, sclera clear, no icterus.   Conjunctiva pink. Ears:  Normal auditory acuity. Nose:  No deformity, discharge,  or lesions. Neck:  Supple; no masses Lungs:  Clear throughout to auscultation.   No wheezes, crackles, or rhonchi.  Heart:  Regular rate and rhythm; no murmurs, no lower extremity edema Abdomen:  Soft, non-distended, nontender, BS active   Rectal:  Deferred  Msk:  Symmetrical without gross deformities. . Neurologic:  Alert and  oriented x4;  grossly normal neurologically. Skin:  Intact without significant lesions or rashes.   Intake/Output from previous day: 12/29 0701 - 12/30 0700 In: 175 [I.V.:175] Out: -  Intake/Output this shift: No intake/output data recorded.  Lab Results: Recent Labs    07/30/18 1851 07/31/18 0353  WBC 10.4 9.7  HGB 7.3* 7.6*  HCT 24.2* 24.9*  PLT 293 315   BMET Recent Labs    07/30/18 1851 07/31/18 0353  NA 135 137  K 4.0 4.2  CL 98 99  CO2 24 26  GLUCOSE 226* 247*  BUN 14 12  CREATININE 0.78 0.76  CALCIUM 8.3* 8.8*   LFT Recent Labs    07/30/18 1851  PROT 5.5*  ALBUMIN 2.7*  AST 15  ALT 12  ALKPHOS 42  BILITOT 1.0  Studies/Results: Ct Head Wo Contrast  Result Date: 07/30/2018 CLINICAL DATA:  Suspect transient ischemic attack. EXAM: CT HEAD WITHOUT CONTRAST TECHNIQUE: Contiguous axial images were obtained from the base of the skull through the vertex without  intravenous contrast. COMPARISON:  None. FINDINGS: Brain: No evidence of acute infarction, hemorrhage, hydrocephalus, extra-axial collection or mass lesion / mass effect. Prominence of the ventricles and sulci reflects mild cortical volume loss. 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 The brainstem and fourth ventricle are within normal limits. The basal ganglia are unremarkable in appearance. No mass effect or midline shift is seen. Vascular: No hyperdense vessel or unexpected calcification. Skull: There is no evidence of fracture; visualized osseous structures are unremarkable in appearance. Sinuses/Orbits: The orbits are within normal limits. The patient is status post bilateral mastoidectomy. The paranasal sinuses are well-aerated. Other: No significant soft tissue abnormalities are seen. IMPRESSION: 1. No acute intracranial pathology seen on CT. 2. Mild cortical volume loss. 3. 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 Electronically Signed   By: Roanna RaiderJeffery  Chang M.D.   On: 07/30/2018 23:39     Willette Clusteraula Ulises Wolfinger, NP-C @  07/31/2018, 9:19 AM

## 2018-07-31 NOTE — Progress Notes (Signed)
Patient Demographics:    Dakota Morton, is a 62 y.o. male, DOB - February 16, 1956, YNW:[id number]  Admit date - 07/30/2018   Admitting Physician Ike Benearolyn J Park, MD  Outpatient Primary MD for the patient is System, Pcp Not In  LOS - 1   No chief complaint on file.       Subjective:    Dakota MollyJames Nell today has no fevers, no emesis,  No chest pain, complains of fatigue, dyspnea on exertion and some dizziness  Assessment  & Plan :    Active Problems:   GI bleed   Duodenal ulcer  Brief summary 62 yo male with past medical history relevant for CAD s/p CABG, hx of sick sinus syncrome s/p PPM, bilateral carotid artery stenosis s/p R carotid stent, HTN, and prior hx of duodenal ulcer s/p IR GDA embolization (2016), [facility] on 07/30/2018 with melena / acute anemia / syncope. He has a hx of UGI bleeding from duodenal ulcer , s/p [facility].   PlaN 1) acute GI bleed--- received 1 unit of packed cells [facility], repeat hemoglobin 7.6 will give additional unit of packed cells on 07/31/2018 for a total of 2 units so far, discussed with GI service for EGD on 08/01/2018 , continue IV Protonix  2) acute blood loss anemia superimposed on chronic anemia-secondary to #1 above, results above #1  3) syncope and possible TIA--- Known bilateral carotid artery disease with R carotid stent, aspirin on hold due to concerns about ongoing GI bleed, CT head without acute findings, there is evidence of old infarcts, continue simvastatin 40 mg daily,  4)DM2-check A1c, Allow some permissive Hyperglycemia rather than risk life-threatening hypoglycemia in a patient with unreliable oral intake--- would be n.p.o. for EGD. Use Novolog/Humalog Sliding scale insulin with Accu-Cheks/Fingersticks as ordered.... Metformin on hold, sliding scale as ordered, Lantus 10 units  nightly   Disposition/Need for in-Hospital Stay- patient unable to be discharged at this time due to GI bleed requiring transfusion and EGD...... patient with dizziness, dyspnea on exertion, fatigue and palpitations with ambulation  Code Status : Full code  Family Communication:   n/a   Disposition Plan  : home  Consults  : GI  DVT Prophylaxis  :  - SCDs (GI bleed)  Lab Results  Component Value Date   PLT 315 07/31/2018    Inpatient Medications  Scheduled Meds: . cloNIDine  0.1 mg Oral BID  . gabapentin  300 mg Oral QHS  . insulin aspart  0-9 Units Subcutaneous TID WC  . insulin glargine  10 Units Subcutaneous QHS  . [START ON 08/03/2018] pantoprazole  40 mg Intravenous Q12H  . [START ON 08/01/2018] simvastatin  40 mg Oral q1800   Continuous Infusions: . pantoprozole (PROTONIX) infusion 8 mg/hr (07/31/18 1622)   PRN Meds:.temazepam    Anti-infectives (From admission, onward)   None        Objective:   Vitals:   07/31/18 1300 07/31/18 1335 07/31/18 1509 07/31/18 1534  BP: (!) 152/68 (!) 149/64 (!) 158/69 (!) 147/71  Pulse: 77 75 81 77  Resp: 16 18 18 16   Temp: 98.4 F (36.9 C) 98.4 F (36.9 C) 97.9 F (36.6 C) 98.4 F (36.9 C)  TempSrc:  Oral Oral Oral Oral  SpO2: 100% 98% 100% 100%  Weight:      Height:        Wt Readings from Last 3 Encounters:  07/30/18 86 kg     Intake/Output Summary (Last 24 hours) at 07/31/2018 1944 Last data filed at 07/31/2018 1820 Gross per 24 hour  Intake 832.33 ml  Output 1350 ml  Net -517.67 ml     Physical Exam Patient is examined daily including today on 07/31/18 , exams remain the same as of yesterday except that has changed   Gen:- Awake Alert,  In no apparent distress  HEENT:- [place].AT, No sclera icterus Neck-Supple Neck,No JVD,.  Lungs-  CTAB , fair symmetrical air movement CV- S1, S2 normal, regular  Abd-  +ve B.Sounds, Abd Soft, No tenderness,    Extremity/Skin:- No  edema, pedal pulses present   Psych-affect is appropriate, oriented x3 Neuro-no new focal deficits, no tremors   Data Review:   Micro Results No results found for this or any previous visit (from the past 240 hour(s)).  Radiology Reports Ct Head Wo Contrast  Result Date: 07/30/2018 CLINICAL DATA:  Suspect transient ischemic attack. EXAM: CT HEAD WITHOUT CONTRAST TECHNIQUE: Contiguous axial images were obtained from the base of the skull through the vertex without intravenous contrast. COMPARISON:  None. FINDINGS: Brain: No evidence of acute infarction, hemorrhage, hydrocephalus, extra-axial collection or mass lesion / mass effect. Prominence of the ventricles and sulci reflects mild cortical volume loss. 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 The brainstem and fourth ventricle are within normal limits. The basal ganglia are unremarkable in appearance. No mass effect or midline shift is seen. Vascular: No hyperdense vessel or unexpected calcification. Skull: There is no evidence of fracture; visualized osseous structures are unremarkable in appearance. Sinuses/Orbits: The orbits are within normal limits. The patient is status post bilateral mastoidectomy. The paranasal sinuses are well-aerated. Other: No significant soft tissue abnormalities are seen. IMPRESSION: 1. No acute intracranial pathology seen on CT. 2. Mild cortical volume loss. 3. 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 Electronically Signed   By: Roanna Raider M.D.   On: 07/30/2018 23:39     CBC Recent Labs  Lab 07/30/18 1851 07/31/18 0353  WBC 10.4 9.7  HGB 7.3* 7.6*  HCT 24.2* 24.9*  PLT 293 315  MCV 89.6 90.2  MCH 27.0 27.5  MCHC 30.2 30.5  RDW 13.8 13.9  LYMPHSABS 1.9  --   MONOABS 1.0  --   EOSABS 0.2  --   BASOSABS 0.0  --     Chemistries  Recent Labs  Lab 07/30/18 1851 07/31/18 0353  NA 135 137  K 4.0 4.2  CL 98 99   CO2 24 26  GLUCOSE 226* 247*  BUN 14 12  CREATININE 0.78 0.76  CALCIUM 8.3* 8.8*  MG 1.7  --   AST 15  --   ALT 12  --   ALKPHOS 42  --   BILITOT 1.0  --    ------------------------------------------------------------------------------------------------------------------ No results for input(s): CHOL, HDL, LDLCALC, TRIG, CHOLHDL, LDLDIRECT in the last 72 hours.  No results found for: HGBA1C ------------------------------------------------------------------------------------------------------------------ No results for input(s): TSH, T4TOTAL, T3FREE, THYROIDAB in the last 72 hours.  Invalid input(s): FREET3 ------------------------------------------------------------------------------------------------------------------ No results for input(s): VITAMINB12, FOLATE, FERRITIN, TIBC, IRON, RETICCTPCT in the last 72 hours.  Coagulation profile No results for input(s): INR, PROTIME in the last 168 hours.  No results for input(s): DDIMER in the last 72 hours.  Cardiac Enzymes No results for input(s): CKMB, TROPONINI, MYOGLOBIN in the last 168 hours.  Invalid input(s): CK ------------------------------------------------------------------------------------------------------------------    Component Value Date/Time   BNP 109.4 (H) 07/30/2018 1851     Shon Haleourage Danaiya Steadman M.D on 07/31/2018 at 7:44 PM  Pager---[phone] Go to [email or web address] - password [email or web address] for contact info  [facility] - Office  [phone]

## 2018-07-31 NOTE — Progress Notes (Signed)
Was informed by central tele that pt had a 6-beat run of v-tach.  Pt was assessed by charge nurse [place], who found the pt to be lethargic and slow to respond.  Vitals BP 171//73 (100) T 98.1 P 74, RR 22, SaO2 98% RA.  Paged [facility] provider Linton FlemingsX. Blount, who gave no additional orders.

## 2018-07-31 NOTE — H&P (View-Only) (Signed)
 Referring Provider: [facility]   Primary Care Physician:  System, Pcp Not In Primary Gastroenterologist:   He says Dr. Gupta     Reason for Consultation:   GI bleed    ASSESSMENT / PLAN:     62 yo male with multiple medical problems as listed below, transferred from [facility] with melena / acute anemia / syncope. He has a hx of UGI bleeding from duodenal ulcer , s/p GDA embolization at [facility] in 2016.  -Patient will need EGD. Would like for him to get additional pRBC since hgb still in mid 7 range after the one unit at [facility]. Hospital will order blood now.  -Keep NPO as we may be able to proceed with EGD later today. The risks and benefits of EGD were discussed and the patient agrees to proceed.  -continue PPI gtt  ADDENDUM: spoke with Hospitalist. Patient hasn't got pRBC yet. Will feed him and plan for EGD tomorrow.   HPI:      HPI: Dakota Morton is a 62 y.o. male with a complex medical history not limited to DM, CAD/ CABG, SSS s/p PPM, bilateral carotid artery stenosis, s/p R stent, HTN, pancreatic tail lesion evaluated by EUS in 2013 at [facility] and felt to be benign. He also has a hx of a liver abscess 2013 s/p drainage, also at [facility] in 2013. CABG in 2016 was complicated by UGI bleed from duodenal ulcer while on plavix for carotid stent. He is s/p IR GDA embolization.   On Thursday or Friday patient says he passed out twice. At that point he had not had any overt GI bleeding, just some nausea. He did finally have evidence for GI bleeding after taking an enema after which time he had two black stools. No associated abdominal pain, weight is down ~ 10 pounds recently. He doesn't take blood thinners, just a daily baby ASA and no other NSAIDS.  Patient was taken to [facility], hgb found to be to 7.7 at [facility] (baseline 13-14). He was transfused one unit of pRBC, started on PPI and transfer on to [facility] on yesterday. Hgb remains in mid 7 range. BUN normal. He  hasn't had any further BMs / bleeding.     Colonoscopy [facility] OPERATIVE PROCEDURE REPORT - 12/08/2011  PATIENT:         [name], [name]   MR #:    [id number] BIRTHDATE:    03/17/1956   GENDER:    Male ATTENDING:    Jason D Conway M.D.   REFERRING MD: FELLOW:      Norman Clark M.D.   ASSISTANT:    Shawn Alexander GIT and Susan Hathcock R.N.   PROCEDURE:   1. diagnostic colonoscopy 2. colonoscopy and biopsy  INDICATIONS:   1. Unexplained diarrhea  MEDICATIONS:   1. Fentanyl 75 mcg IV 2. Versed 5 mg IV  DESCRIPTION OF PROCEDURE: After the risks benefits and alternatives of the procedure were thoroughly explained, Informed consent was obtained. A digital rectal exam was performed and revealed no abnormalities of the rectum. The [id number] endoscope was introduced through the anus and advanced to the terminal ileum which was intubated for a short distance. The prep was good. The instrument was then slowly withdrawn as the colon was fully examined.   The mucosa appeared normal Retroflexed views revealed no abnormalities. Multiple random biopsies were taken from throughout the colon for pathology. The scope was then completely withdrawn from the patient and the procedure terminated.  LIMITATIONS:     without limitations COMPLICATIONS:   There were no complications.  ENDOSCOPIC IMPRESSION:   1. Normal mucosa 2. Random biopsies obtained in setting diarrhea   RECOMMENDATION:   Await biopsy results Follow up with primary team  PATHOLOGY:   Pathology - Biopsy Taken     Past Medical History:  Diagnosis Date  . Atherosclerotic heart disease of native coronary artery without angina pectoris 03/21/2012   Stent 2003 Stent 2005 B TCA to right coronary artery 2003 as well as in April 2015  . Bilateral carotid artery stenosis 12/30/2015  . Carotid stenosis 05/02/2015  . Duodenal ulcer 2016  . Essential hypertension  03/21/2012  . GERD (gastroesophageal reflux disease) 03/21/2012  . SSS (sick sinus syndrome) (HCC) 05/22/2015   PMH: CABG   Prior to Admission medications   Medication Sig Start Date End Date Taking? Authorizing Provider  amLODipine (NORVASC) 10 MG tablet Take 10 mg by mouth. 06/19/18  Yes [provider]  aspirin EC 81 MG tablet Take 81 mg by mouth daily.   Yes [provider]  benazepril (LOTENSIN) 40 MG tablet Take 40 mg by mouth. 06/19/18  Yes [provider]  fenofibrate 160 MG tablet Take by mouth. 04/01/17  Yes [provider]  furosemide (LASIX) 20 MG tablet Take by mouth. 04/24/18  Yes [provider]  insulin NPH Human (HUMULIN N,NOVOLIN N) 100 UNIT/ML injection  03/24/17  Yes [provider]  metFORMIN (GLUCOPHAGE) 1000 MG tablet Take by mouth. 10/21/15  Yes [provider]  metoprolol tartrate (LOPRESSOR) 100 MG tablet Take 50 mg by mouth 2 (two) times daily. 02/14/18  Yes [provider]  simvastatin (ZOCOR) 40 MG tablet Take by mouth. 03/09/18  Yes [provider]  temazepam (RESTORIL) 15 MG capsule Take by mouth. 08/20/15  Yes [provider]  Gabapentin, Once-Daily, 300 MG TABS Take 300 mg by mouth daily.    [provider]  insulin detemir (LEVEMIR) 100 UNIT/ML injection Inject into the skin.    [provider]    Current Facility-Administered Medications  Medication Dose Route Frequency Provider Last Rate Last Dose  . cloNIDine (CATAPRES) tablet 0.1 mg  0.1 mg Oral BID Park, Carolyn J, MD   0.1 mg at 07/31/18 0841  . gabapentin (NEURONTIN) tablet 300 mg  300 mg Oral QHS Park, Carolyn J, MD   300 mg at 07/30/18 2231  . insulin aspart (novoLOG) injection 0-9 Units  0-9 Units Subcutaneous TID WC Park, Carolyn J, MD   3 Units at 07/31/18 0841  . insulin glargine (LANTUS) injection 10 Units  10 Units Subcutaneous QHS Park, Carolyn J, MD   10 Units at 07/30/18 2231  .  pantoprazole (PROTONIX) 80 mg in sodium chloride 0.9 % 250 mL (0.32 mg/mL) infusion  8 mg/hr Intravenous Continuous Park, Carolyn J, MD 25 mL/hr at 07/31/18 0603 8 mg/hr at 07/31/18 0603  . [START ON 08/03/2018] pantoprazole (PROTONIX) injection 40 mg  40 mg Intravenous Q12H Park, Carolyn J, MD      . [START ON 08/01/2018] simvastatin (ZOCOR) tablet 40 mg  40 mg Oral q1800 Park, Carolyn J, MD      . temazepam (RESTORIL) capsule 15 mg  15 mg Oral QHS PRN Park, Carolyn J, MD   15 mg at 07/30/18 2242    Allergies as of 07/29/2018  . (Not on File)    No family history on file.  Social History   Socioeconomic History  . Marital status: Married    Spouse name:   Not on file  . Number of children: Not on file  . Years of education: Not on file  . Highest education level: Not on file  Occupational History  . Not on file  Social Needs  . Financial resource strain: Not on file  . Food insecurity:    Worry: Not on file    Inability: Not on file  . Transportation needs:    Medical: Not on file    Non-medical: Not on file  Tobacco Use  . Smoking status: Former Smoker    Years: 25.00  . Smokeless tobacco: Never Used  Substance and Sexual Activity  . Alcohol use: Not Currently  . Drug use: Never  . Sexual activity: Not on file  Lifestyle  . Physical activity:    Days per week: Not on file    Minutes per session: Not on file  . Stress: Not on file  Relationships  . Social connections:    Talks on phone: Not on file    Gets together: Not on file    Attends religious service: Not on file    Active member of club or organization: Not on file    Attends meetings of clubs or organizations: Not on file    Relationship status: Not on file  . Intimate partner violence:    Fear of current or ex partner: Not on file    Emotionally abused: Not on file    Physically abused: Not on file    Forced sexual activity: Not on file  Other Topics Concern  . Not on file  Social History Narrative  .  Not on file    Review of Systems: All systems reviewed and negative except where noted in HPI.  Physical Exam: Vital signs in last 24 hours: Temp:  [98.1 F (36.7 C)-100 F (37.8 C)] 98.3 F (36.8 C) (12/30 0454) Pulse Rate:  [74-83] 78 (12/30 0454) Resp:  [17-22] 17 (12/30 0454) BP: (158-171)/(65-73) 158/68 (12/30 0454) SpO2:  [96 %-99 %] 99 % (12/30 0454) Weight:  [86 kg] 86 kg (12/29 1813) Last BM Date: 07/28/18 General:   Alert, well-developed,  white male in NAD Psych:  Pleasant, cooperative. Normal mood and affect. Eyes:  Pupils equal, sclera clear, no icterus.   Conjunctiva pink. Ears:  Normal auditory acuity. Nose:  No deformity, discharge,  or lesions. Neck:  Supple; no masses Lungs:  Clear throughout to auscultation.   No wheezes, crackles, or rhonchi.  Heart:  Regular rate and rhythm; no murmurs, no lower extremity edema Abdomen:  Soft, non-distended, nontender, BS active   Rectal:  Deferred  Msk:  Symmetrical without gross deformities. . Neurologic:  Alert and  oriented x4;  grossly normal neurologically. Skin:  Intact without significant lesions or rashes.   Intake/Output from previous day: 12/29 0701 - 12/30 0700 In: 175 [I.V.:175] Out: -  Intake/Output this shift: No intake/output data recorded.  Lab Results: Recent Labs    07/30/18 1851 07/31/18 0353  WBC 10.4 9.7  HGB 7.3* 7.6*  HCT 24.2* 24.9*  PLT 293 315   BMET Recent Labs    07/30/18 1851 07/31/18 0353  NA 135 137  K 4.0 4.2  CL 98 99  CO2 24 26  GLUCOSE 226* 247*  BUN 14 12  CREATININE 0.78 0.76  CALCIUM 8.3* 8.8*   LFT Recent Labs    07/30/18 1851  PROT 5.5*  ALBUMIN 2.7*  AST 15  ALT 12  ALKPHOS 42  BILITOT 1.0       Studies/Results: Ct Head Wo Contrast  Result Date: 07/30/2018 CLINICAL DATA:  Suspect transient ischemic attack. EXAM: CT HEAD WITHOUT CONTRAST TECHNIQUE: Contiguous axial images were obtained from the base of the skull through the vertex without  intravenous contrast. COMPARISON:  None. FINDINGS: Brain: No evidence of acute infarction, hemorrhage, hydrocephalus, extra-axial collection or mass lesion / mass effect. Prominence of the ventricles and sulci reflects mild cortical volume loss. 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 The brainstem and fourth ventricle are within normal limits. The basal ganglia are unremarkable in appearance. No mass effect or midline shift is seen. Vascular: No hyperdense vessel or unexpected calcification. Skull: There is no evidence of fracture; visualized osseous structures are unremarkable in appearance. Sinuses/Orbits: The orbits are within normal limits. The patient is status post bilateral mastoidectomy. The paranasal sinuses are well-aerated. Other: No significant soft tissue abnormalities are seen. IMPRESSION: 1. No acute intracranial pathology seen on CT. 2. Mild cortical volume loss. 3. 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 Electronically Signed   By: Jeffery  Chang M.D.   On: 07/30/2018 23:39     Paula Guenther, NP-C @  07/31/2018, 9:19 AM     

## 2018-07-31 NOTE — Progress Notes (Addendum)
Inpatient Diabetes Program Recommendations  AACE/ADA: New Consensus Statement on Inpatient Glycemic Control (2015)  Target Ranges:  Prepandial:   less than 140 mg/dL      Peak postprandial:   less than 180 mg/dL (1-2 hours)      Critically ill patients:  140 - 180 mg/dL   Lab Results  Component Value Date   GLUCAP 227 (H) 07/31/2018    Review of Glycemic Control Results for Dakota Morton, Dakota Morton (MRN [id number]) as of 07/31/2018 12:59  Ref. Range 07/31/2018 05:59 07/31/2018 08:30 07/31/2018 11:59  Glucose-Capillary Latest Ref Range: 70 - 99 mg/dL 782249 (H) 956226 (H) 213227 (H)   Diabetes history: Type 2 DM Current orders for Inpatient glycemic control: Novolog 0-9 units TID, Lantus 10 units QHS  Inpatient Diabetes Program Recommendations:     Consider increasing Lantus to 14 units QHS.   Addendum @1402 : Spoke with patient regarding outpatient management of diabetes. Verified home medications and per patient, "I take Novolin R and dose it depending on my blood sugar. I don't count carbs. I just take what I need." When asked specifically, he states he was given a sliding scale per PCP. Patient does not disclose openly regarding information. Additionally, he states, "It isn't difficult, if my blood sugar is 300-400 mg/dL, Ill take 50 units and I average 25 units with meals."   Reviewed with patient his current inpatient insulin needs, however this is without normal food intake and thus, anticipate sugars to increase and reivewed the importance of having follow up for his diabetes. At this time, reviewed A1C, however, value would be inaccurate given current hemoglobin. Patient has needed supplies and has no further questions at this time. Encouraged to arrange a follow up appointment with his PCP post hospitalization.   Thanks, Lujean RaveLauren Nyeshia Mysliwiec, MSN, RNC-OB Diabetes Coordinator [phone] (8a-5p)

## 2018-08-01 ENCOUNTER — Inpatient Hospital Stay (HOSPITAL_COMMUNITY): Payer: BLUE CROSS/BLUE SHIELD

## 2018-08-01 ENCOUNTER — Encounter (HOSPITAL_COMMUNITY)
Admission: AD | Disposition: A | Discharge status: Home Health | Payer: Self-pay | Source: Other Acute Inpatient Hospital | Attending: Family Medicine

## 2018-08-01 ENCOUNTER — Inpatient Hospital Stay (HOSPITAL_COMMUNITY): Payer: BLUE CROSS/BLUE SHIELD | Admitting: Certified Registered"

## 2018-08-01 ENCOUNTER — Encounter (HOSPITAL_COMMUNITY): Payer: Self-pay | Admitting: *Deleted

## 2018-08-01 DIAGNOSIS — K264 Chronic or unspecified duodenal ulcer with hemorrhage: Secondary | ICD-10-CM

## 2018-08-01 HISTORY — PX: BIOPSY: SHX5522

## 2018-08-01 HISTORY — PX: HOT HEMOSTASIS: SHX5433

## 2018-08-01 HISTORY — PX: ESOPHAGOGASTRODUODENOSCOPY (EGD) WITH PROPOFOL: SHX5813

## 2018-08-01 LAB — CBC
HCT: 28.3 % — ABNORMAL LOW (ref 39.0–52.0)
Hemoglobin: 8.6 g/dL — ABNORMAL LOW (ref 13.0–17.0)
MCH: 27 pg (ref 26.0–34.0)
MCHC: 30.4 g/dL (ref 30.0–36.0)
MCV: 88.7 fL (ref 80.0–100.0)
Platelets: 232 10*3/uL (ref 150–400)
RBC: 3.19 MIL/uL — ABNORMAL LOW (ref 4.22–5.81)
RDW: 14.7 % (ref 11.5–15.5)
WBC: 8.6 10*3/uL (ref 4.0–10.5)
nRBC: 0.2 % (ref 0.0–0.2)

## 2018-08-01 LAB — BASIC METABOLIC PANEL
Anion gap: 13 (ref 5–15)
BUN: 9 mg/dL (ref 8–23)
CO2: 24 mmol/L (ref 22–32)
Calcium: 9 mg/dL (ref 8.9–10.3)
Chloride: 101 mmol/L (ref 98–111)
Creatinine, Ser: 0.65 mg/dL (ref 0.61–1.24)
GFR calc Af Amer: >60 mL/min (ref >60)
GFR calc non Af Amer: >60 mL/min (ref >60)
Glucose, Bld: 227 mg/dL — ABNORMAL HIGH (ref 70–99)
Potassium: 4.2 mmol/L (ref 3.5–5.1)
SODIUM: 138 mmol/L (ref 135–145)

## 2018-08-01 LAB — TYPE AND SCREEN
ABO/RH(D): O POS
Antibody Screen: NEGATIVE
UNIT DIVISION: 0

## 2018-08-01 LAB — GLUCOSE, CAPILLARY
GLUCOSE-CAPILLARY: 168 mg/dL — AB (ref 70–99)
Glucose-Capillary: 161 mg/dL — ABNORMAL HIGH (ref 70–99)
Glucose-Capillary: 213 mg/dL — ABNORMAL HIGH (ref 70–99)
Glucose-Capillary: 222 mg/dL — ABNORMAL HIGH (ref 70–99)
Glucose-Capillary: 226 mg/dL — ABNORMAL HIGH (ref 70–99)

## 2018-08-01 LAB — BPAM RBC
BLOOD PRODUCT EXPIRATION DATE: 202001282359
ISSUE DATE / TIME: 201912301307
Unit Type and Rh: 5100

## 2018-08-01 SURGERY — ESOPHAGOGASTRODUODENOSCOPY (EGD) WITH PROPOFOL
Anesthesia: Monitor Anesthesia Care

## 2018-08-01 MED ORDER — PROPOFOL 10 MG/ML IV BOLUS
INTRAVENOUS | Status: DC | PRN
  Administered 2018-08-01: 10 mg via INTRAVENOUS
  Administered 2018-08-01: 20 mg via INTRAVENOUS

## 2018-08-01 MED ORDER — SODIUM CHLORIDE (PF) 0.9 % IJ SOLN
PREFILLED_SYRINGE | INTRAMUSCULAR | Status: DC | PRN
  Administered 2018-08-01: 5 mL

## 2018-08-01 MED ORDER — MIDAZOLAM HCL 5 MG/5ML IJ SOLN
INTRAMUSCULAR | Status: DC | PRN
  Administered 2018-08-01: 1 mg via INTRAVENOUS

## 2018-08-01 MED ORDER — ASPIRIN 81 MG PO CHEW
81.0000 mg | CHEWABLE_TABLET | Freq: Every day | ORAL | Status: DC
  Administered 2018-08-02 – 2018-08-03 (×2): 81 mg via ORAL
  Filled 2018-08-01 (×2): qty 1

## 2018-08-01 MED ORDER — IOHEXOL 300 MG/ML  SOLN
100.0000 mL | Freq: Once | INTRAMUSCULAR | Status: AC | PRN
  Administered 2018-08-01: 100 mL via INTRAVENOUS

## 2018-08-01 MED ORDER — GLUCAGON HCL RDNA (DIAGNOSTIC) 1 MG IJ SOLR
INTRAMUSCULAR | Status: AC
  Filled 2018-08-01: qty 1

## 2018-08-01 MED ORDER — SUCRALFATE 1 GM/10ML PO SUSP
1.0000 g | Freq: Three times a day (TID) | ORAL | Status: DC
  Administered 2018-08-01 – 2018-08-03 (×10): 1 g via ORAL
  Filled 2018-08-01 (×10): qty 10

## 2018-08-01 MED ORDER — PROPOFOL 500 MG/50ML IV EMUL
INTRAVENOUS | Status: DC | PRN
  Administered 2018-08-01: 100 ug/kg/min via INTRAVENOUS

## 2018-08-01 MED ORDER — GLUCAGON HCL RDNA (DIAGNOSTIC) 1 MG IJ SOLR
INTRAMUSCULAR | Status: DC | PRN
  Administered 2018-08-01: 1 mg via INTRAVENOUS

## 2018-08-01 MED ORDER — ONDANSETRON HCL 4 MG/2ML IJ SOLN
4.0000 mg | Freq: Four times a day (QID) | INTRAMUSCULAR | Status: DC | PRN
  Administered 2018-08-01: 4 mg via INTRAVENOUS
  Filled 2018-08-01: qty 2

## 2018-08-01 MED ORDER — BUTAMBEN-TETRACAINE-BENZOCAINE 2-2-14 % EX AERO
INHALATION_SPRAY | CUTANEOUS | Status: DC | PRN
  Administered 2018-08-01: 2 via TOPICAL

## 2018-08-01 MED ORDER — LIDOCAINE HCL (CARDIAC) PF 100 MG/5ML IV SOSY
PREFILLED_SYRINGE | INTRAVENOUS | Status: DC | PRN
  Administered 2018-08-01: 40 mg via INTRATRACHEAL

## 2018-08-01 MED ORDER — EPINEPHRINE PF 1 MG/10ML IJ SOSY
PREFILLED_SYRINGE | INTRAMUSCULAR | Status: AC
  Filled 2018-08-01: qty 10

## 2018-08-01 MED ORDER — LACTATED RINGERS IV SOLN
INTRAVENOUS | Status: AC | PRN
  Administered 2018-08-01: 10:00:00 via INTRAVENOUS
  Administered 2018-08-01: 1000 mL via INTRAVENOUS

## 2018-08-01 SURGICAL SUPPLY — 15 items

## 2018-08-01 NOTE — Interval H&P Note (Signed)
History and Physical Interval Note:  08/01/2018 10:15 AM  Dakota Morton  has presented today for surgery, with the diagnosis of melena  The various methods of treatment have been discussed with the patient and family. After consideration of risks, benefits and other options for treatment, the patient has consented to  Procedure(s): ESOPHAGOGASTRODUODENOSCOPY (EGD) WITH PROPOFOL (N/A) as a surgical intervention .  The patient's history has been reviewed, patient examined, no change in status, stable for surgery.  I have reviewed the patient's chart and labs.  Questions were answered to the patient's satisfaction.     Verlin DikeVito V Neelam Tiggs

## 2018-08-01 NOTE — Progress Notes (Signed)
Received patient back from endoscopy. Patient alert and oriented x 4. Complaining of mild abdominal discomfort. Started on clear liquid diet. BP 165/89. Denies any other complaints. Will continue to monitor.

## 2018-08-01 NOTE — Progress Notes (Signed)
Patient Demographics:    Dakota Morton, is a 62 y.o. male, DOB - [birth year], ZOX:[id number]  Admit date - 07/30/2018   Admitting Physician Ike Benearolyn J Park, MD  Outpatient Primary MD for the patient is System, Pcp Not In  LOS - 2   No chief complaint on file.       Subjective:    Dakota Morton today has no fevers, no emesis,  No chest pain, complains of fatigue, dyspnea on exertion  Assessment  & Plan :    Active Problems:   GI bleed   Duodenal ulcer   Acute blood loss anemia  Brief summary 62 yo male with past medical history relevant for CAD s/p CABG, hx of sick sinus syncrome s/p PPM, bilateral carotid artery stenosis s/p R carotid stent, HTN, and prior hx of duodenal ulcer s/p IR GDA embolization (2016), [facility] on 07/30/2018 with melena / acute anemia / syncope. He has a hx of UGI bleeding from duodenal ulcer , s/p GDA embolization at [facility] in [facility].  EGD 08/01/18 LA Grade C esophagitis.                           - Normal upper third of esophagus and middle third                            of esophagus.                           - Gastritis. Biopsied.                           - Normal antrum and pylorus.                           - Normal duodenal bulb and third portion of the                            duodenum.                           - Multiple non-bleeding duodenal ulcers with                            pigmented material  PlaN 1) acute GI bleed--- [facility], r , received additional unit of packed cells on 07/31/2018 for a total of 2 units so far, hemoglobin up to 8.6 , EGD on 08/01/2018 as above, continue Protonix twice daily and Carafate 1 g 4 times daily, repeat EGD in 6 weeks advised, CT abdomen and pelvis to evaluate for possible extra-abdominal etiology of duodenal ulcers pending  2) acute blood loss anemia  superimposed on chronic anemia-secondary to #1 above, results above #1  3) syncope and possible TIA--- Known bilateral carotid artery disease with R carotid stent, restart aspirin , CT head without acute findings, there is evidence of old infarcts, continue simvastatin 40 mg  daily,  4)DM2-check A1c,  Use Novolog/Humalog Sliding scale insulin with Accu-Cheks/Fingersticks as ordered.... Metformin on hold, sliding scale as ordered, Lantus 10 units nightly   Disposition/Need for in-Hospital Stay- patient unable to be discharged at this time due to GI bleed requiring transfusion and EGD...... patient with dizziness, dyspnea on exertion, fatigue and palpitations with ambulation  Code Status : Full code  Family Communication:   n/a   Disposition Plan  : home  Consults  : GI  DVT Prophylaxis  :  - SCDs (GI bleed)  Lab Results  Component Value Date   PLT 232 08/01/2018    Inpatient Medications  Scheduled Meds: . cloNIDine  0.1 mg Oral BID  . gabapentin  300 mg Oral QHS  . insulin aspart  0-9 Units Subcutaneous TID WC  . insulin glargine  10 Units Subcutaneous QHS  . [START ON 08/03/2018] pantoprazole  40 mg Intravenous Q12H  . simvastatin  40 mg Oral q1800  . sucralfate  1 g Oral TID WC & HS   Continuous Infusions: . pantoprozole (PROTONIX) infusion 8 mg/hr (08/01/18 1655)   PRN Meds:.acetaminophen, temazepam    Anti-infectives (From admission, onward)   None        Objective:   Vitals:   08/01/18 1102 08/01/18 1115 08/01/18 1140 08/01/18 1415  BP: (!) 201/87 (!) 209/75 (!) 165/89 140/68  Pulse: (!) 101 87  79  Resp: (!) 23 20  18   Temp: 97.7 F (36.5 C)   98.2 F (36.8 C)  TempSrc: Oral   Oral  SpO2: 99% 95%  96%  Weight:      Height:        Wt Readings from Last 3 Encounters:  08/01/18 86.2 kg     Intake/Output Summary (Last 24 hours) at 08/01/2018 1911 Last data filed at 08/01/2018 1655 Gross per 24 hour  Intake 440.3 ml  Output 675 ml  Net -234.7 ml       Physical Exam Patient is examined daily including today on 08/01/18 , exams remain the same as of yesterday except that has changed   Gen:- Awake Alert,  In no apparent distress  HEENT:- [place]ender.AT, No sclera icterus Neck-Supple Neck,No JVD,.  Lungs-  CTAB , fair symmetrical air movement CV- S1, S2 normal, regular  Abd-  +ve B.Sounds, Abd Soft, No tenderness,    Extremity/Skin:- No  edema, pedal pulses present  Psych-affect is appropriate, oriented x3 Neuro-no new focal deficits, no tremors   Data Review:   Micro Results No results found for this or any previous visit (from the past 240 hour(s)).  Radiology Reports Ct Head Wo Contrast  Result Date: 07/30/2018 CLINICAL DATA:  Suspect transient ischemic attack. EXAM: CT HEAD WITHOUT CONTRAST TECHNIQUE: Contiguous axial images were obtained from the base of the skull through the vertex without intravenous contrast. COMPARISON:  None. FINDINGS: Brain: No evidence of acute infarction, hemorrhage, hydrocephalus, extra-axial collection or mass lesion / mass effect. Prominence of the ventricles and sulci reflects mild cortical volume loss. 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 The brainstem and fourth ventricle are within normal limits. The basal ganglia are unremarkable in appearance. No mass effect or midline shift is seen. Vascular: No hyperdense vessel or unexpected calcification. Skull: There is no evidence of fracture; visualized osseous structures are unremarkable in appearance. Sinuses/Orbits: The orbits are within normal limits. The patient is status post bilateral mastoidectomy. The paranasal sinuses are well-aerated. Other: No significant soft tissue  abnormalities are seen. IMPRESSION: 1. No acute intracranial pathology seen on CT. 2. Mild cortical volume loss. 3. 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 Electronically  Signed   By: Roanna RaiderJeffery  Chang M.D.   On: 07/30/2018 23:39     CBC Recent Labs  Lab 07/30/18 1851 07/31/18 0353 08/01/18 0341  WBC 10.4 9.7 8.6  HGB 7.3* 7.6* 8.6*  HCT 24.2* 24.9* 28.3*  PLT 293 315 232  MCV 89.6 90.2 88.7  MCH 27.0 27.5 27.0  MCHC 30.2 30.5 30.4  RDW 13.8 13.9 14.7  LYMPHSABS 1.9  --   --   MONOABS 1.0  --   --   EOSABS 0.2  --   --   BASOSABS 0.0  --   --     Chemistries  Recent Labs  Lab 07/30/18 1851 07/31/18 0353 08/01/18 0341  NA 135 137 138  K 4.0 4.2 4.2  CL 98 99 101  CO2 24 26 24   GLUCOSE 226* 247* 227*  BUN 14 12 9   CREATININE 0.78 0.76 0.65  CALCIUM 8.3* 8.8* 9.0  MG 1.7  --   --   AST 15  --   --   ALT 12  --   --   ALKPHOS 42  --   --   BILITOT 1.0  --   --     Coagulation profile No results for input(s): INR, PROTIME in the last 168 hours.  No results for input(s): DDIMER in the last 72 hours.  Cardiac Enzymes No results for input(s): CKMB, TROPONINI, MYOGLOBIN in the last 168 hours.  Invalid input(s): CK ------------------------------------------------------------------------------------------------------------------    Component Value Date/Time   BNP 109.4 (H) 07/30/2018 1851     Shon Haleourage Halli Equihua M.D on 08/01/2018 at 7:11 PM  Pager---[phone] Go to [email or web address] - password [email or web address] for contact info  [facility] - Office  [phone]

## 2018-08-01 NOTE — Progress Notes (Signed)
Inpatient Diabetes Program Recommendations  AACE/ADA: New Consensus Statement on Inpatient Glycemic Control (2015)  Target Ranges:  Prepandial:   less than 140 mg/dL      Peak postprandial:   less than 180 mg/dL (1-2 hours)      Critically ill patients:  140 - 180 mg/dL   Lab Results  Component Value Date   GLUCAP 226 (H) 08/01/2018    Review of Glycemic Control Results for [name], Dakota Morton (MRN [id number]) as of 08/01/2018 08:57  Ref. Range 07/31/2018 11:59 07/31/2018 17:10 07/31/2018 20:58 08/01/2018 00:03  Glucose-Capillary Latest Ref Range: 70 - 99 mg/dL 409227 (H) 811190 (H) 914273 (H) 226 (H)   Diabetes history: Type 2 DM Current orders for Inpatient glycemic control: Novolog 0-9 units TID, Lantus 10 units QHS  Inpatient Diabetes Program Recommendations:    Even though patient NPO for procedure, continues to trend above inpatient goals of 180 mg/dL, Consider increasing Lantus to 14 units QHS.   Would encourage follow up for repeat A1C and DM management outpatient d/t to hemoglobin <8 this admission.  Thanks, Lujean RaveLauren Zyaira Vejar, MSN, RNC-OB Diabetes Coordinator [phone] (8a-5p)

## 2018-08-01 NOTE — Op Note (Signed)
[facility] Patient Name: Dakota Morton Procedure Date : 08/01/2018 MRN: [id number] Attending MD: Doristine Locks , MD Date of Birth: 1956/05/13 CSN: [id number] Age: 62 Admit Type: Inpatient Procedure:                Upper GI endoscopy Indications:              Acute post hemorrhagic anemia, Melena. Has a                            history of duodenal ulcer in 2016 requring coil                            embolization of the GDA. Providers:                Doristine Locks, MD, Zoe Lan, RN, Harrington Challenger,                            Technician, Koren Bound, Technician, Ginette Otto,                            CRNA Referring MD:              Medicines:                Monitored Anesthesia Care Complications:            No immediate complications. Estimated Blood Loss:     Estimated blood loss was minimal. Procedure:                Pre-Anesthesia Assessment:                           - Prior to the procedure, a History and Physical                            was performed, and patient medications and                            allergies were reviewed. The patient's tolerance of                            previous anesthesia was also reviewed. The risks                            and benefits of the procedure and the sedation                            options and risks were discussed with the patient.                            All questions were answered, and informed consent                            was obtained. Prior Anticoagulants: The patient has  taken aspirin, last dose was 2 days prior to                            procedure. ASA Grade Assessment: III - A patient                            with severe systemic disease. After reviewing the                            risks and benefits, the patient was deemed in                            satisfactory condition to undergo the procedure.                           After obtaining informed  consent, the endoscope was                            passed under direct vision. Throughout the                            procedure, the patient's blood pressure, pulse, and                            oxygen saturations were monitored continuously. The                            GIF-H190 ([id number]) Olympus adult EGD was introduced                            through the mouth, and advanced to the third part                            of duodenum. The upper GI endoscopy was                            accomplished without difficulty. The patient                            tolerated the procedure well. Scope In: Scope Out: Findings:      LA Grade C (one or more mucosal breaks continuous between tops of 2 or       more mucosal folds, less than 75% circumference) esophagitis with no       bleeding was found in the lower third of the esophagus.      The upper third of the esophagus and middle third of the esophagus were       normal.      Scattered mild inflammation characterized by erythema was found in the       gastric fundus and in the gastric body. Biopsies were taken with a cold       forceps for Helicobacter pylori testing. Estimated blood loss was       minimal.      The gastric antrum and pylorus were normal.  The duodenal bulb and third portion of the duodenum were normal.      Few non-bleeding cratered duodenal ulcers noted in the second portion of       the duodenum. 2 of the ulcers with pigmented material. No active       bleeding. The largest lesion was 6 mm in largest dimension but the       ulcers were in close proximity with each other and 2 were confluent.       Decision was made to treat the 2 ulcers with high grade stigmata of       bleeding. Coagulation for hemostasis using heater probe was used on each       ulcer with successful ablation. For additional hemostasis, one       hemostatic clip was successfully placed on the larger ulcer. A clip was       not possible  over the 2nd ulcer, as the clip prong would have embedded       in an adjacent ulcer, increasing risk of injury and bleeding. Each of       the ulcers were then successfully injected with a total of 7 mL of a       1:10,000 solution of epinephrine for hemostasis. There was no bleeding       at the conclusion of the case. Multiple mucosal biopsies were then taken       with a cold forceps for histology. Estimated blood loss was minimal. Impression:               - LA Grade C esophagitis.                           - Normal upper third of esophagus and middle third                            of esophagus.                           - Gastritis. Biopsied.                           - Normal antrum and pylorus.                           - Normal duodenal bulb and third portion of the                            duodenum.                           - Multiple non-bleeding duodenal ulcers with                            pigmented material. Treated with a heater probe.                            Clip was placed. Injected. Biopsied. Recommendation:           - Return patient to hospital ward for ongoing care.                           -  Clear liquid diet today.                           - No aspirin, ibuprofen, naproxen, or other                            non-steroidal anti-inflammatory drugs.                           - Await pathology results.                           - Perform a CT scan (computed tomography) of                            abdomen with contrast and pelvis with contrast                            today to evaluate for extraluminal etiology for                            multiple duodenal ulcers.                           - Send serum gastrin level.                           - Use Protonix (pantoprazole) 40 mg IV BID.                           - Repeat upper endoscopy in 6 weeks to check                            healing.                           - Use sucralfate suspension 1 gram  PO QID for 2                            weeks. Procedure Code(s):        --- Professional ---                           43255, 59, Esophagogastroduodenoscopy, flexible,                            transoral; with control of bleeding, any method                           43239, Esophagogastroduodenoscopy, flexible,                            transoral; with biopsy, single or multiple Diagnosis Code(s):        --- Professional ---                           K20.9, Esophagitis,  unspecified                           K29.70, Gastritis, unspecified, without bleeding                           K26.9, Duodenal ulcer, unspecified as acute or                            chronic, without hemorrhage or perforation                           D62, Acute posthemorrhagic anemia                           K92.1, Melena (includes Hematochezia) CPT copyright 2018 American [employer] Association. All rights reserved. The codes documented in this report are preliminary and upon coder review may  be revised to meet current compliance requirements. Doristine LocksVito Cirigliano, MD 08/01/2018 11:17:09 AM Number of Addenda: 0

## 2018-08-01 NOTE — Transfer of Care (Signed)
Immediate Anesthesia Transfer of Care Note  Patient: Dakota Morton  Procedure(s) Performed: ESOPHAGOGASTRODUODENOSCOPY (EGD) WITH PROPOFOL (N/A ) HOT HEMOSTASIS (ARGON PLASMA COAGULATION/BICAP) (N/A )  Patient Location: Endoscopy Unit  Anesthesia Type:MAC  Level of Consciousness: awake, alert  and sedated  Airway & Oxygen Therapy: Patient connected to nasal cannula oxygen  Post-op Assessment: Post -op Vital signs reviewed and stable  Post vital signs: stable  Last Vitals:  Vitals Value Taken Time  BP    Temp    Pulse    Resp    SpO2      Last Pain:  Vitals:   08/01/18 0946  TempSrc:   PainSc: 0-No pain      Patients Stated Pain Goal: 0 (20/25/42 7062)  Complications: No apparent anesthesia complications

## 2018-08-01 NOTE — Anesthesia Preprocedure Evaluation (Signed)
Anesthesia Evaluation  Patient identified by MRN, date of birth, ID band Patient awake    Reviewed: Allergy & Precautions, NPO status , Patient's Chart, lab work & pertinent test results, reviewed documented beta blocker date and time   History of Anesthesia Complications Negative for: history of anesthetic complications  Airway Mallampati: I  TM Distance: >3 FB Neck ROM: Full    Dental  (+) Edentulous Upper, Partial Lower, Poor Dentition, Dental Advisory Given   Pulmonary COPD, former smoker,    breath sounds clear to auscultation       Cardiovascular hypertension, Pt. on medications and Pt. on home beta blockers (-) angina+ CAD, + Cardiac Stents, + CABG and + Peripheral Vascular Disease (s/p carotid stent)  + pacemaker (for SSS)  Rhythm:Regular Rate:Normal  10/19 ECHO: Moderate concentric LVH, Well preserved LV systolic function with mild apical hypokinesis. EF 55-60%   Neuro/Psych negative neurological ROS     GI/Hepatic Neg liver ROS, GERD  Medicated,  Endo/Other  diabetes (glu 213), Oral Hypoglycemic Agents, Insulin Dependent  Renal/GU negative Renal ROS     Musculoskeletal   Abdominal   Peds  Hematology  (+) Blood dyscrasia (Hb 8.6), anemia ,   Anesthesia Other Findings   Reproductive/Obstetrics                             Anesthesia Physical Anesthesia Plan  ASA: III  Anesthesia Plan: MAC   Post-op Pain Management:    Induction:   PONV Risk Score and Plan: 1 and Treatment may vary due to age or medical condition  Airway Management Planned: Nasal Cannula and Natural Airway  Additional Equipment:   Intra-op Plan:   Post-operative Plan:   Informed Consent: I have reviewed the patients History and Physical, chart, labs and discussed the procedure including the risks, benefits and alternatives for the proposed anesthesia with the patient or authorized representative who has  indicated his/her understanding and acceptance.   Dental advisory given  Plan Discussed with: CRNA and Surgeon  Anesthesia Plan Comments: (Plan routine monitors, MAC)        Anesthesia Quick Evaluation

## 2018-08-01 NOTE — Anesthesia Postprocedure Evaluation (Signed)
Anesthesia Post Note  Patient: Dakota Morton  Procedure(s) Performed: ESOPHAGOGASTRODUODENOSCOPY (EGD) WITH PROPOFOL (N/A ) HOT HEMOSTASIS (ARGON PLASMA COAGULATION/BICAP) (N/A ) BIOPSY HEMOSTASIS CONTROL HEMOSTASIS CLIP PLACEMENT     Patient location during evaluation: Endoscopy Anesthesia Type: MAC Level of consciousness: awake and alert, patient cooperative and oriented Pain management: pain level controlled Vital Signs Assessment: post-procedure vital signs reviewed and stable Respiratory status: spontaneous breathing, nonlabored ventilation and respiratory function stable Cardiovascular status: blood pressure returned to baseline and stable Postop Assessment: no apparent nausea or vomiting Anesthetic complications: no    Last Vitals:  Vitals:   08/01/18 1102 08/01/18 1115  BP: (!) 201/87 (!) 209/75  Pulse: (!) 101 87  Resp: (!) 23 20  Temp: 36.5 C   SpO2: 99% 95%    Last Pain:  Vitals:   08/01/18 1115  TempSrc:   PainSc: 6                  Fumio Vandam,E. Sharren Schnurr

## 2018-08-01 NOTE — Anesthesia Procedure Notes (Signed)
Procedure Name: MAC Date/Time: 08/01/2018 10:32 AM Performed by: Lavell Luster, CRNA Pre-anesthesia Checklist: Patient identified, Emergency Drugs available, Suction available, Patient being monitored and Timeout performed Patient Re-evaluated:Patient Re-evaluated prior to induction Oxygen Delivery Method: Nasal cannula Preoxygenation: Pre-oxygenation with 100% oxygen Induction Type: IV induction Placement Confirmation: breath sounds checked- equal and bilateral and positive ETCO2 Dental Injury: Teeth and Oropharynx as per pre-operative assessment

## 2018-08-02 ENCOUNTER — Inpatient Hospital Stay (HOSPITAL_COMMUNITY): Payer: BLUE CROSS/BLUE SHIELD

## 2018-08-02 ENCOUNTER — Encounter (HOSPITAL_COMMUNITY): Payer: Self-pay | Admitting: Gastroenterology

## 2018-08-02 DIAGNOSIS — R29898 Other symptoms and signs involving the musculoskeletal system: Secondary | ICD-10-CM

## 2018-08-02 DIAGNOSIS — I639 Cerebral infarction, unspecified: Principal | ICD-10-CM

## 2018-08-02 LAB — CBC
HCT: 27.2 % — ABNORMAL LOW (ref 39.0–52.0)
Hemoglobin: 8.3 g/dL — ABNORMAL LOW (ref 13.0–17.0)
MCH: 26.9 pg (ref 26.0–34.0)
MCHC: 30.5 g/dL (ref 30.0–36.0)
MCV: 88 fL (ref 80.0–100.0)
Platelets: 304 10*3/uL (ref 150–400)
RBC: 3.09 MIL/uL — ABNORMAL LOW (ref 4.22–5.81)
RDW: 14.8 % (ref 11.5–15.5)
WBC: 13.5 10*3/uL — ABNORMAL HIGH (ref 4.0–10.5)
nRBC: 0.3 % — ABNORMAL HIGH (ref 0.0–0.2)

## 2018-08-02 LAB — GLUCOSE, CAPILLARY
GLUCOSE-CAPILLARY: 211 mg/dL — AB (ref 70–99)
Glucose-Capillary: 188 mg/dL — ABNORMAL HIGH (ref 70–99)
Glucose-Capillary: 226 mg/dL — ABNORMAL HIGH (ref 70–99)
Glucose-Capillary: 228 mg/dL — ABNORMAL HIGH (ref 70–99)

## 2018-08-02 LAB — BASIC METABOLIC PANEL
Anion gap: 8 (ref 5–15)
BUN: 8 mg/dL (ref 8–23)
CHLORIDE: 100 mmol/L (ref 98–111)
CO2: 28 mmol/L (ref 22–32)
Calcium: 8.5 mg/dL — ABNORMAL LOW (ref 8.9–10.3)
Creatinine, Ser: 0.85 mg/dL (ref 0.61–1.24)
GFR calc Af Amer: >60 mL/min (ref >60)
GFR calc non Af Amer: >60 mL/min (ref >60)
Glucose, Bld: 191 mg/dL — ABNORMAL HIGH (ref 70–99)
Potassium: 3.8 mmol/L (ref 3.5–5.1)
Sodium: 136 mmol/L (ref 135–145)

## 2018-08-02 MED ORDER — INSULIN GLARGINE 100 UNIT/ML ~~LOC~~ SOLN
12.0000 [IU] | Freq: Every day | SUBCUTANEOUS | Status: DC
  Administered 2018-08-02: 12 [IU] via SUBCUTANEOUS
  Filled 2018-08-02 (×2): qty 0.12

## 2018-08-02 NOTE — Plan of Care (Signed)
  Problem: Education: Goal: Ability to identify signs and symptoms of gastrointestinal bleeding will improve Outcome: Progressing   Problem: Bowel/Gastric: Goal: Will show no signs and symptoms of gastrointestinal bleeding Outcome: Progressing   Problem: Fluid Volume: Goal: Will show no signs and symptoms of excessive bleeding Outcome: Progressing   Problem: Clinical Measurements: Goal: Complications related to the disease process, condition or treatment will be avoided or minimized Outcome: Progressing   Problem: Spiritual Needs Goal: Ability to function at adequate level Outcome: Progressing   Problem: Health Behavior/Discharge Planning: Goal: Ability to manage health-related needs will improve Outcome: Progressing   Problem: Clinical Measurements: Goal: Ability to maintain clinical measurements within normal limits will improve Outcome: Progressing Goal: Will remain free from infection Outcome: Progressing Goal: Diagnostic test results will improve Outcome: Progressing Goal: Respiratory complications will improve Outcome: Progressing Goal: Cardiovascular complication will be avoided Outcome: Progressing

## 2018-08-02 NOTE — Consult Note (Signed)
NEURO HOSPITALIST CONSULT NOTE   Requestig physician: Dr. Mariea Clonts  Reason for Consult: Subacute bifrontal strokes on CT  History obtained from:   Patient and Chart    HPI:                                                                                                                                          Dakota Morton is an 63 y.o. male with CAD s/p stents, bilateral carotid artery stenosis s/p right carotid stent, HTN and sick sinus syndrome s/p PPM, who presented to the hospital on 12/29 with an episode of syncope as well as black tarry stools. After the syncopal episode at home, his wife noticed that he had a left sided lower facial droop; the patient also recalls having had worsened left hand clumsiness at about the time of his syncopal spell. There was no evidence that he had hit his head when he was found down slumped against a door. The patient initially had presented to [facility] where his Hgb was found to be 7.7. Given his stroke-like symptoms, a CT was obtained initially, which was read as being consistent with chronic strokes. A second CT was obtained today, with evolution of one of the previously seen hypodensities, revealing it as most likely being a subacute stroke. Neurology was called for stroke evaluation.    His home meds include daily ASA.   Initial ROS on admission was negative for F/C, cough, CP, edema and weight change.   CT head: 1. Small cortical and white matter infarcts along the bilateral frontal convexity that may be subacute. 2. Remote appearing right parietal cortex infarcts. Remote lacunar infarct in the right corona radiata. 3. No intracranial hemorrhage or progressive finding.  EKG: NSR. Prolonged QT interval.   Past Medical History:  Diagnosis Date  . Atherosclerotic heart disease of native coronary artery without angina pectoris 03/21/2012   Stent 2003 Stent 2005 B TCA to right coronary artery 2003 as well as in April 2015   . Bilateral carotid artery stenosis 12/30/2015  . Carotid stenosis 05/02/2015  . Duodenal ulcer 2016  . Essential hypertension 03/21/2012  . GERD (gastroesophageal reflux disease) 03/21/2012  . SSS (sick sinus syndrome) (HCC) 05/22/2015    History reviewed. No pertinent surgical history.  History reviewed. No pertinent family history.            Social History:  reports that he has quit smoking. He quit after 25.00 years of use. He has never used smokeless tobacco. He reports previous alcohol use. He reports that he does not use drugs.  Allergies  Allergen Reactions  . Codeine Nausea Only    MEDICATIONS:  Prior to Admission:  Medications Prior to Admission  Medication Sig Dispense Refill Last Dose  . aspirin EC 81 MG tablet Take 81 mg by mouth daily.   Past Week at Unknown time  . cloNIDine (CATAPRES) 0.1 MG tablet Take 0.1 mg by mouth 2 (two) times daily.   Past Week at Unknown time  . fenofibrate 160 MG tablet Take 160 mg by mouth daily.    Past Week at Unknown time  . gabapentin (NEURONTIN) 400 MG capsule Take 800 mg by mouth at bedtime.   Past Month at Unknown time  . insulin regular (NOVOLIN R,HUMULIN R) 100 units/mL injection Inject 0-20 Units into the skin 3 (three) times daily before meals.   Past Week at Unknown time  . metFORMIN (GLUCOPHAGE) 1000 MG tablet Take 1,000 mg by mouth 2 (two) times daily with a meal.    Past Week at Unknown time  . metoprolol tartrate (LOPRESSOR) 100 MG tablet Take 50 mg by mouth 2 (two) times daily.   Past Week at Unknown time  . simvastatin (ZOCOR) 40 MG tablet Take 40 mg by mouth daily at 6 PM.    Past Week at Unknown time  . temazepam (RESTORIL) 15 MG capsule Take 15 mg by mouth at bedtime as needed for sleep.    Past Month at Unknown time  . amLODipine-benazepril (LOTREL) 10-40 MG capsule Take 1 capsule by mouth daily.   Not Taking  at Unknown time   Scheduled: . aspirin  81 mg Oral Daily  . cloNIDine  0.1 mg Oral BID  . gabapentin  300 mg Oral QHS  . insulin aspart  0-9 Units Subcutaneous TID WC  . insulin glargine  12 Units Subcutaneous QHS  . [START ON 08/03/2018] pantoprazole  40 mg Intravenous Q12H  . simvastatin  40 mg Oral q1800  . sucralfate  1 g Oral TID WC & HS    ROS:                                                                                                                                       Has had headaches periodically with none currently. No vision changes. No dysphagia. No neck pain. No CP. Other ROS as per HPI.    Blood pressure (!) 155/89, pulse 83, temperature 98.2 F (36.8 C), temperature source Oral, resp. rate 16, height 5\' 9"  (1.753 m), weight 86.2 kg, SpO2 100 %.   General Examination:  Physical Exam  HEENT-  [place]/AT   Lungs- Respirations unlabored Extremities- Warm and well perfused  Neurological Examination Mental Status: Awake and alert, fully oriented, thought content appropriate.  Speech fluent with intact comprehension and naming.  Able to follow all commands without difficulty. Cranial Nerves: II: Visual fields intact with no extinction to DSS. PERRL.   III,IV, VI: No ptosis. EOMI without nystagmus.  V,VII: Mild left facial droop. Temp sensation equal bilaterally.  VIII: Hearing intact to voice.  IX,X: No hypophonia or hoarseness XI: Weak shoulder shrug on the left.  XII: Midline tongue extension Motor: LUE: 4/5 weakness left grip and biceps, otherwise 5/5 LLE: 4/5 hip flexion and knee extension RUE: 5/5 RLE: 5/5 Normal tone throughout; no atrophy noted No pronator drift Sensory: Temp and light touch intact all 4 extremities. No extinction.  Deep Tendon Reflexes: 2+ bilateral biceps and brachioradialis. 1+ right patella, 2+ left patella. 0 right achilles, 2+ left  achilles. Cerebellar: No ataxia with FNF bilaterally  Gait: Deferred   Lab Results: Basic Metabolic Panel: Recent Labs  Lab 07/30/18 1851 07/31/18 0353 08/01/18 0341 08/02/18 0411  NA 135 137 138 136  K 4.0 4.2 4.2 3.8  CL 98 99 101 100  CO2 24 26 24 28   GLUCOSE 226* 247* 227* 191*  BUN 14 12 9 8   CREATININE 0.78 0.76 0.65 0.85  CALCIUM 8.3* 8.8* 9.0 8.5*  MG 1.7  --   --   --     CBC: Recent Labs  Lab 07/30/18 1851 07/31/18 0353 08/01/18 0341 08/02/18 0411  WBC 10.4 9.7 8.6 13.5*  NEUTROABS 7.0  --   --   --   HGB 7.3* 7.6* 8.6* 8.3*  HCT 24.2* 24.9* 28.3* 27.2*  MCV 89.6 90.2 88.7 88.0  PLT 293 315 232 304    Cardiac Enzymes: No results for input(s): CKTOTAL, CKMB, CKMBINDEX, TROPONINI in the last 168 hours.  Lipid Panel: No results for input(s): CHOL, TRIG, HDL, CHOLHDL, VLDL, LDLCALC in the last 168 hours.  Imaging: Ct Head Wo Contrast  Result Date: 08/02/2018 CLINICAL DATA:  Left facial droop. EXAM: CT HEAD WITHOUT CONTRAST TECHNIQUE: Contiguous axial images were obtained from the base of the skull through the vertex without intravenous contrast. COMPARISON:  Two days ago FINDINGS: Brain: Patchy low-density along the bilateral frontal cortex subcortical white matter that appears similar to prior. These are indistinct and could be subacute. There is more chronic appearing cortical infarcts in the right parietal lobe with volume loss. There has been a remote lacunar infarct in the right centrum semiovale. No acute hemorrhage.  No evidence of interval infarct. Small arachnoid cyst in the left middle cranial fossa. Vascular: Negative Skull: Negative Sinuses/Orbits: Clear.  Bilateral mastoidectomy IMPRESSION: 1. Small cortical and white matter infarcts along the bilateral frontal convexity that may be subacute. 2. Remote appearing right parietal cortex infarcts. Remote lacunar infarct in the right corona radiata. 3. No intracranial hemorrhage or progressive finding.  Electronically Signed   By: Marnee Spring M.D.   On: 08/02/2018 16:16   Ct Abdomen Pelvis W Contrast  Result Date: 08/01/2018 CLINICAL DATA:  Recurrent duodenal ulcers. Acute GI bleed. EXAM: CT ABDOMEN AND PELVIS WITH CONTRAST TECHNIQUE: Multidetector CT imaging of the abdomen and pelvis was performed using the standard protocol following bolus administration of intravenous contrast. CONTRAST:  OMNIPAQUE IOHEXOL 300 MG/ML  SOLN COMPARISON:  CT scan dated 05/25/2015, 05/02/2015, 09/06/2012 and 12/05/2011 FINDINGS: Lower chest: There is a tiny area of patchy infiltrate or  atelectasis at the left lung base posteriorly. Heart size is normal. Pacemaker in place. No pericardial effusion. CABG. Hepatobiliary: No focal liver abnormality is seen. Status post cholecystectomy. No biliary dilatation. There is chronic air in the biliary tree. Pancreas: There is a chronic thick-walled cystic lesion on the tip of the tail of the pancreas measuring 4.6 cm, essentially unchanged since the prior study. There is a small internal septation. The remainder of the pancreas appears normal. Spleen: Normal in size without focal abnormality. Adrenals/Urinary Tract: There is a 14 mm exophytic cyst on the lower pole of the right kidney, minimally larger than on the prior study. Kidneys and adrenal glands and bladder are otherwise normal. Stomach/Bowel: The bowel appears normal including the appendix. Small surgical clip in the third portion of the duodenum. Surgical clips around the distal antrum of the stomach. Prior cholecystectomy. Vascular/Lymphatic: Aortic atherosclerosis. No enlarged abdominal or pelvic lymph nodes. Reproductive: Prostate is unremarkable. Other: No abdominal wall hernia or abnormality. No abdominopelvic ascites. Musculoskeletal: No acute or significant osseous findings. IMPRESSION: 1. Tiny area of patchy infiltrate or atelectasis at the left lung base posteriorly. 2. Stable thick-walled cystic lesion on the  tip of the tail of the pancreas of unknown etiology. Could this be a benign cystic neoplasm? Aortic Atherosclerosis (ICD10-I70.0). Electronically Signed   By: Francene BoyersJames  Maxwell M.D.   On: 08/01/2018 20:31    Assessment:  63 year old male with mild left sided weakness of new onset 1. Exam reveals mild left facial droop and mild LUE weakness, which correlate with the CT finding of a subacute evolving right frontal lobe deep white matter ischemic stroke 2. Classifiable as having failed ASA monotherapy. DAPT is indicated as he has CAD s/p stenting 3. Stroke risk factors: Carotid stenosis, HTN and CAD  Recommendations: 1. Adding Plavix to ASA.  2. Continue simvastatin 3. PT/OT/Speech 4. BP management. Out of permissive HTN time window.  5. Unable to obtain MRI due to pacemaker 6. CTA of head and neck 7. TTE   Electronically signed: Dr. Caryl PinaEric Karessa Onorato 08/02/2018, 7:24 PM

## 2018-08-02 NOTE — Progress Notes (Addendum)
Progress Note   Subjective  Chief Complaint: Melena, status post EGD 08/01/2018 with multiple duodenal ulcers  This morning, patient explains that he is feeling well, no abdominal pain.  He has not had a bowel movement since admission to the hospital.  He does ask about his CT results.  Tells me he did well with a clear liquid diet this morning.   Objective   Vital signs in last 24 hours: Temp:  [97.7 F (36.5 C)-102.6 F (39.2 C)] 98.5 F (36.9 C) (01/01 0501) Pulse Rate:  [79-113] 85 (01/01 0809) Resp:  [16-23] 16 (01/01 0501) BP: (121-209)/(64-89) 135/65 (01/01 0809) SpO2:  [95 %-99 %] 97 % (01/01 0501) Last BM Date: 07/28/18 General:    white male in NAD Heart:  Regular rate and rhythm; no murmurs Lungs: Respirations even and unlabored, lungs CTA bilaterally Abdomen:  Soft, nontender and nondistended. Normal bowel sounds. Extremities:  Without edema. Neurologic:  Alert and oriented,  grossly normal neurologically. Psych:  Cooperative. Normal mood and affect.  Intake/Output from previous day: 12/31 0701 - 01/01 0700 In: 142.2 [I.V.:142.2] Out: 600 [Urine:600]  Lab Results: Recent Labs    07/31/18 0353 08/01/18 0341 08/02/18 0411  WBC 9.7 8.6 13.5*  HGB 7.6* 8.6* 8.3*  HCT 24.9* 28.3* 27.2*  PLT 315 232 304   BMET Recent Labs    07/31/18 0353 08/01/18 0341 08/02/18 0411  NA 137 138 136  K 4.2 4.2 3.8  CL 99 101 100  CO2 26 24 28   GLUCOSE 247* 227* 191*  BUN 12 9 8   CREATININE 0.76 0.65 0.85  CALCIUM 8.8* 9.0 8.5*   LFT Recent Labs    07/30/18 1851  PROT 5.5*  ALBUMIN 2.7*  AST 15  ALT 12  ALKPHOS 42  BILITOT 1.0   Studies/Results: Ct Abdomen Pelvis W Contrast  Result Date: 08/01/2018 CLINICAL DATA:  Recurrent duodenal ulcers. Acute GI bleed. EXAM: CT ABDOMEN AND PELVIS WITH CONTRAST TECHNIQUE: Multidetector CT imaging of the abdomen and pelvis was performed using the standard protocol following bolus administration of intravenous  contrast. CONTRAST:  100mL OMNIPAQUE IOHEXOL 300 MG/ML  SOLN COMPARISON:  CT scan dated 05/25/2015, 05/02/2015, 09/06/2012 and 12/05/2011 FINDINGS: Lower chest: There is a tiny area of patchy infiltrate or atelectasis at the left lung base posteriorly. Heart size is normal. Pacemaker in place. No pericardial effusion. CABG. Hepatobiliary: No focal liver abnormality is seen. Status post cholecystectomy. No biliary dilatation. There is chronic air in the biliary tree. Pancreas: There is a chronic thick-walled cystic lesion on the tip of the tail of the pancreas measuring 4.6 cm, essentially unchanged since the prior study. There is a small internal septation. The remainder of the pancreas appears normal. Spleen: Normal in size without focal abnormality. Adrenals/Urinary Tract: There is a 14 mm exophytic cyst on the lower pole of the right kidney, minimally larger than on the prior study. Kidneys and adrenal glands and bladder are otherwise normal. Stomach/Bowel: The bowel appears normal including the appendix. Small surgical clip in the third portion of the duodenum. Surgical clips around the distal antrum of the stomach. Prior cholecystectomy. Vascular/Lymphatic: Aortic atherosclerosis. No enlarged abdominal or pelvic lymph nodes. Reproductive: Prostate is unremarkable. Other: No abdominal wall hernia or abnormality. No abdominopelvic ascites. Musculoskeletal: No acute or significant osseous findings. IMPRESSION: 1. Tiny area of patchy infiltrate or atelectasis at the left lung base posteriorly. 2. Stable thick-walled cystic lesion on the tip of the tail of the pancreas of unknown etiology. Could  this be a benign cystic neoplasm? Aortic Atherosclerosis (ICD10-I70.0). Electronically Signed   By: Francene Boyers M.D.   On: 08/01/2018 20:31   EGD 08/01/2018: Findings:      LA Grade C (one or more mucosal breaks continuous between tops of 2 or       more mucosal folds, less than 75% circumference) esophagitis with no        bleeding was found in the lower third of the esophagus.      The upper third of the esophagus and middle third of the esophagus were       normal.      Scattered mild inflammation characterized by erythema was found in the       gastric fundus and in the gastric body. Biopsies were taken with a cold       forceps for Helicobacter pylori testing. Estimated blood loss was       minimal.      The gastric antrum and pylorus were normal.      The duodenal bulb and third portion of the duodenum were normal.      Few non-bleeding cratered duodenal ulcers noted in the second portion of       the duodenum. 2 of the ulcers with pigmented material. No active       bleeding. The largest lesion was 6 mm in largest dimension but the       ulcers were in close proximity with each other and 2 were confluent.       Decision was made to treat the 2 ulcers with high grade stigmata of       bleeding. Coagulation for hemostasis using heater probe was used on each       ulcer with successful ablation. For additional hemostasis, one       hemostatic clip was successfully placed on the larger ulcer. A clip was       not possible over the 2nd ulcer, as the clip prong would have embedded       in an adjacent ulcer, increasing risk of injury and bleeding. Each of       the ulcers were then successfully injected with a total of 7 mL of a       1:10,000 solution of epinephrine for hemostasis. There was no bleeding       at the conclusion of the case. Multiple mucosal biopsies were then taken       with a cold forceps for histology. Estimated blood loss was minimal. Impression:               - LA Grade C esophagitis.                           - Normal upper third of esophagus and middle third                            of esophagus.                           - Gastritis. Biopsied.                           - Normal antrum and pylorus.                           -  Normal duodenal bulb and third portion of the                             duodenum.                           - Multiple non-bleeding duodenal ulcers with                            pigmented material. Treated with a heater probe.                            Clip was placed. Injected. Biopsied. Recommendation:           - Return patient to hospital ward for ongoing care.                           - Clear liquid diet today.                           - No aspirin, ibuprofen, naproxen, or other                            non-steroidal anti-inflammatory drugs.                           - Await pathology results.                           - Perform a CT scan (computed tomography) of                            abdomen with contrast and pelvis with contrast                            today to evaluate for extraluminal etiology for                            multiple duodenal ulcers.                           - Send serum gastrin level.                           - Use Protonix (pantoprazole) 40 mg IV BID.                           - Repeat upper endoscopy in 6 weeks to check                            healing.                           - Use sucralfate suspension 1 gram PO QID for 2  weeks.   Assessment / Plan:   Assessment: 1.  Melena with acute anemia: EGD 08/01/2018 as above with multiple nonbleeding duodenal ulcers with pigmented material treated with a heater probe, clip placed and injected 2.  Abnormal CT of the abdomen: Showing pancreatic cyst with question of cystic neoplasm 3.  Leukocytosis: New overnight, uncertain etiology  Plan: 1.  See recommendations as above after EGD including Pantoprazole 40 mg twice daily, continue for 8 weeks 2.  Repeat EGD in 6 weeks outpatient 3.  Continue Carafate 1 g p.o. 4 times daily for 2 weeks 4.  Increase to full liquid diet today, can increase as tolerated thereafter 5.  Please await further recommendations from Dr. Barron Alvine later today in regards to abnormal CT and  leukocytosis  Thank you for your kind consultation, we will continue to follow.   LOS: 3 days   Unk Lightning  08/02/2018, 10:08 AM

## 2018-08-02 NOTE — Progress Notes (Addendum)
Patient Demographics:    Dakota MollyJames Morton, is a 63 y.o. male, DOB - 10-Nov-1955, ONG:[id number]  Admit date - 07/30/2018   Admitting Physician Ike Benearolyn J Park, MD  Outpatient Primary MD for the patient is System, Pcp Not In  LOS - 3   No chief complaint on file.       Subjective:    Dakota Morton today has no fevers, no emesis,  No chest pain, complains of fatigue, dyspnea on exertion, dizziness, left upper extremity weakness, left-sided facial droop  Assessment  & Plan :    Active Problems:   GI bleed   Duodenal ulcer   Acute blood loss anemia  Brief summary 63 yo male with past medical history relevant for CAD s/p CABG, hx of sick sinus syncrome s/p PPM, bilateral carotid artery stenosis s/p R carotid stent, HTN, and prior hx of duodenal ulcer s/p IR GDA embolization (2016), [facility]2018 with melena / acute anemia / syncope. He has a hx of UGI bleeding from duodenal ulcer , [facility].  EGD 08/01/18 LA Grade C esophagitis.                           - Normal upper third of esophagus and middle third                            of esophagus.                           - Gastritis. Biopsied.                           - Normal antrum and pylorus.                           - Normal duodenal bulb and third portion of the                            duodenum.                           - Multiple non-bleeding duodenal ulcers with                            pigmented material   Plan 1)Acute GI bleed--- [facility], , received additional unit of packed cells on 07/31/2018 for a total of 2 units so far, hemoglobin up to 8.3 , EGD on 08/01/2018 as above, continue Protonix twice daily and Carafate 1 g 4 times daily, repeat EGD in 6 weeks advised, CT abdomen and pelvis to evaluate for possible extra-abdominal  etiology of duodenal ulcers failed to demonstrate any acute findings  2) acute blood loss anemia superimposed on chronic anemia-secondary to #1 above, results above #1  3)Lt Facial Droop/ syncope and possible TIA--- Known bilateral carotid artery disease with R carotid stent, c/n  aspirin ,  CT head on  08/02/2018 demonstrates small cortical and white matter infarcts bilaterally and then may be subacute CVAs, prior CT Head from 07/30/2018 showed evidence of old infarcts, continue simvastatin 40 mg daily, given acute GI bleed patient may not be a candidate for Plavix... Discussed with Dr. Wilford CornerArora from neurology official neurology consult pending  4)DM2-check A1c,  Use Novolog/Humalog Sliding scale insulin with Accu-Cheks/Fingersticks as ordered.... Metformin on hold, sliding scale as ordered, increase Lantus to 12 units nightly   Disposition/Need for in-Hospital Stay- patient unable to be discharged at this time due to GI bleed requiring transfusion and possible subacute stroke ...... patient with dizziness, dyspnea on exertion, fatigue and palpitations with ambulation  Code Status : Full code  Family Communication:   n/a   Disposition Plan  : home  Consults  : GI/Neurology  DVT Prophylaxis  :  - SCDs (GI bleed)  Lab Results  Component Value Date   PLT 304 08/02/2018    Inpatient Medications  Scheduled Meds: . aspirin  81 mg Oral Daily  . cloNIDine  0.1 mg Oral BID  . gabapentin  300 mg Oral QHS  . insulin aspart  0-9 Units Subcutaneous TID WC  . insulin glargine  12 Units Subcutaneous QHS  . [START ON 08/03/2018] pantoprazole  40 mg Intravenous Q12H  . simvastatin  40 mg Oral q1800  . sucralfate  1 g Oral TID WC & HS   Continuous Infusions: . pantoprozole (PROTONIX) infusion 8 mg/hr (08/02/18 0639)   PRN Meds:.acetaminophen, ondansetron (ZOFRAN) IV, temazepam    Anti-infectives (From admission, onward)   None        Objective:   Vitals:   08/01/18 2230 08/02/18 0501  08/02/18 0809 08/02/18 1637  BP:  (!) 141/64 135/65 (!) 155/89  Pulse:  88 85 83  Resp:  16    Temp: 98.3 F (36.8 C) 98.5 F (36.9 C)  98.2 F (36.8 C)  TempSrc: Oral Oral  Oral  SpO2:  97%  100%  Weight:      Height:        Wt Readings from Last 3 Encounters:  08/01/18 86.2 kg     Intake/Output Summary (Last 24 hours) at 08/02/2018 1655 Last data filed at 08/02/2018 1624 Gross per 24 hour  Intake -  Output 650 ml  Net -650 ml     Physical Exam Patient is examined daily including today on 08/02/2018 , exams remain the same as of yesterday except that has changed   Gen:- Awake Alert,  In no apparent distress  HEENT:- [place].AT, No sclera icterus, Lt facial droop Neck-Supple Neck,No JVD, Lt sided pacemaker in situ Lungs-  CTAB , fair symmetrical air movement CV- S1, S2 normal, regular  Abd-  +ve B.Sounds, Abd Soft, No tenderness,    Extremity/Skin:- No  edema, pedal pulses present  Psych-affect is appropriate, oriented x3 Neuro-left-sided facial droop, 4/5 strength in left upper extremity 5/5 on the right,    Data Review:   Micro Results No results found for this or any previous visit (from the past 240 hour(s)).  Radiology Reports Ct Head Wo Contrast  Result Date: 08/02/2018 CLINICAL DATA:  Left facial droop. EXAM: CT HEAD WITHOUT CONTRAST TECHNIQUE: Contiguous axial images were obtained from the base of the skull through the vertex without intravenous contrast. COMPARISON:  Two days ago FINDINGS: Brain: Patchy low-density along the bilateral frontal cortex subcortical white matter that appears similar to prior. These are indistinct and could be subacute. There is more  chronic appearing cortical infarcts in the right parietal lobe with volume loss. There has been a remote lacunar infarct in the right centrum semiovale. No acute hemorrhage.  No evidence of interval infarct. Small arachnoid cyst in the left middle cranial fossa. Vascular: Negative Skull: Negative Sinuses/Orbits:  Clear.  Bilateral mastoidectomy IMPRESSION: 1. Small cortical and white matter infarcts along the bilateral frontal convexity that may be subacute. 2. Remote appearing right parietal cortex infarcts. Remote lacunar infarct in the right corona radiata. 3. No intracranial hemorrhage or progressive finding. Electronically Signed   By: Marnee Spring M.D.   On: 08/02/2018 16:16   Ct Head Wo Contrast  Result Date: 07/30/2018 CLINICAL DATA:  Suspect transient ischemic attack. EXAM: CT HEAD WITHOUT CONTRAST TECHNIQUE: Contiguous axial images were obtained from the base of the skull through the vertex without intravenous contrast. COMPARISON:  None. FINDINGS: Brain: No evidence of acute infarction, hemorrhage, hydrocephalus, extra-axial collection or mass lesion / mass effect. Prominence of the ventricles and sulci reflects mild cortical volume loss. 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 The brainstem and fourth ventricle are within normal limits. The basal ganglia are unremarkable in appearance. No mass effect or midline shift is seen. Vascular: No hyperdense vessel or unexpected calcification. Skull: There is no evidence of fracture; visualized osseous structures are unremarkable in appearance. Sinuses/Orbits: The orbits are within normal limits. The patient is status post bilateral mastoidectomy. The paranasal sinuses are well-aerated. Other: No significant soft tissue abnormalities are seen. IMPRESSION: 1. No acute intracranial pathology seen on CT. 2. Mild cortical volume loss. 3. 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 Electronically Signed   By: Roanna Raider M.D.   On: 07/30/2018 23:39   Ct Abdomen Pelvis W Contrast  Result Date: 08/01/2018 CLINICAL DATA:  Recurrent duodenal ulcers. Acute GI bleed. EXAM: CT ABDOMEN AND PELVIS WITH CONTRAST TECHNIQUE: Multidetector CT imaging of the  abdomen and pelvis was performed using the standard protocol following bolus administration of intravenous contrast. CONTRAST:  OMNIPAQUE IOHEXOL 300 MG/ML  SOLN COMPARISON:  CT scan dated 05/25/2015, 05/02/2015, 09/06/2012 and 12/05/2011 FINDINGS: Lower chest: There is a tiny area of patchy infiltrate or atelectasis at the left lung base posteriorly. Heart size is normal. Pacemaker in place. No pericardial effusion. CABG. Hepatobiliary: No focal liver abnormality is seen. Status post cholecystectomy. No biliary dilatation. There is chronic air in the biliary tree. Pancreas: There is a chronic thick-walled cystic lesion on the tip of the tail of the pancreas measuring 4.6 cm, essentially unchanged since the prior study. There is a small internal septation. The remainder of the pancreas appears normal. Spleen: Normal in size without focal abnormality. Adrenals/Urinary Tract: There is a 14 mm exophytic cyst on the lower pole of the right kidney, minimally larger than on the prior study. Kidneys and adrenal glands and bladder are otherwise normal. Stomach/Bowel: The bowel appears normal including the appendix. Small surgical clip in the third portion of the duodenum. Surgical clips around the distal antrum of the stomach. Prior cholecystectomy. Vascular/Lymphatic: Aortic atherosclerosis. No enlarged abdominal or pelvic lymph nodes. Reproductive: Prostate is unremarkable. Other: No abdominal wall hernia or abnormality. No abdominopelvic ascites. Musculoskeletal: No acute or significant osseous findings. IMPRESSION: 1. Tiny area of patchy infiltrate or atelectasis at the left lung base posteriorly. 2. Stable thick-walled cystic lesion on the tip of the tail of the pancreas of unknown etiology. Could this be a benign  cystic neoplasm? Aortic Atherosclerosis (ICD10-I70.0). Electronically Signed   By: Francene Boyers M.D.   On: 08/01/2018 20:31     CBC Recent Labs  Lab 07/30/18 1851 07/31/18 0353 08/01/18 0341  08/02/18 0411  WBC 10.4 9.7 8.6 13.5*  HGB 7.3* 7.6* 8.6* 8.3*  HCT 24.2* 24.9* 28.3* 27.2*  PLT 293 315 232 304  MCV 89.6 90.2 88.7 88.0  MCH 27.0 27.5 27.0 26.9  MCHC 30.2 30.5 30.4 30.5  RDW 13.8 13.9 14.7 14.8  LYMPHSABS 1.9  --   --   --   MONOABS 1.0  --   --   --   EOSABS 0.2  --   --   --   BASOSABS 0.0  --   --   --     Chemistries  Recent Labs  Lab 07/30/18 1851 07/31/18 0353 08/01/18 0341 08/02/18 0411  NA 135 137 138 136  K 4.0 4.2 4.2 3.8  CL 98 99 101 100  CO2 24 26 24 28   GLUCOSE 226* 247* 227* 191*  BUN 14 12 9 8   CREATININE 0.78 0.76 0.65 0.85  CALCIUM 8.3* 8.8* 9.0 8.5*  MG 1.7  --   --   --   AST 15  --   --   --   ALT 12  --   --   --   ALKPHOS 42  --   --   --   BILITOT 1.0  --   --   --    -----------------------------------------------------------------------------------------------------------------    Component Value Date/Time   BNP 109.4 (H) 07/30/2018 1851     Manny Vitolo M.D on 08/02/2018 at 4:55 PM  Pager---[phone] Go to [email or web address] - password [email or web address] for contact info  [facility] - Office  [phone]

## 2018-08-03 ENCOUNTER — Inpatient Hospital Stay (HOSPITAL_COMMUNITY): Payer: BLUE CROSS/BLUE SHIELD

## 2018-08-03 DIAGNOSIS — I6330 Cerebral infarction due to thrombosis of unspecified cerebral artery: Secondary | ICD-10-CM | POA: Diagnosis present

## 2018-08-03 DIAGNOSIS — I6523 Occlusion and stenosis of bilateral carotid arteries: Secondary | ICD-10-CM

## 2018-08-03 DIAGNOSIS — R55 Syncope and collapse: Secondary | ICD-10-CM

## 2018-08-03 DIAGNOSIS — Z45018 Encounter for adjustment and management of other part of cardiac pacemaker: Secondary | ICD-10-CM

## 2018-08-03 DIAGNOSIS — I639 Cerebral infarction, unspecified: Secondary | ICD-10-CM | POA: Diagnosis present

## 2018-08-03 DIAGNOSIS — I63511 Cerebral infarction due to unspecified occlusion or stenosis of right middle cerebral artery: Secondary | ICD-10-CM

## 2018-08-03 LAB — COMPREHENSIVE METABOLIC PANEL
ALT: 14 U/L (ref 0–44)
AST: 11 U/L — ABNORMAL LOW (ref 15–41)
Albumin: 2.5 g/dL — ABNORMAL LOW (ref 3.5–5.0)
Alkaline Phosphatase: 51 U/L (ref 38–126)
Anion gap: 9 (ref 5–15)
BUN: 6 mg/dL — ABNORMAL LOW (ref 8–23)
CO2: 29 mmol/L (ref 22–32)
CREATININE: 0.8 mg/dL (ref 0.61–1.24)
Calcium: 8.9 mg/dL (ref 8.9–10.3)
Chloride: 100 mmol/L (ref 98–111)
GFR calc Af Amer: >60 mL/min (ref >60)
GFR calc non Af Amer: >60 mL/min (ref >60)
Glucose, Bld: 225 mg/dL — ABNORMAL HIGH (ref 70–99)
Potassium: 3.7 mmol/L (ref 3.5–5.1)
Sodium: 138 mmol/L (ref 135–145)
Total Bilirubin: 0.2 mg/dL — ABNORMAL LOW (ref 0.3–1.2)
Total Protein: 5.8 g/dL — ABNORMAL LOW (ref 6.5–8.1)

## 2018-08-03 LAB — LIPID PANEL
Cholesterol: 125 mg/dL (ref 0–200)
HDL: 27 mg/dL — ABNORMAL LOW (ref >40)
LDL CALC: 70 mg/dL (ref 0–99)
Total CHOL/HDL Ratio: 4.6 RATIO
Triglycerides: 141 mg/dL (ref <150)
VLDL: 28 mg/dL (ref 0–40)

## 2018-08-03 LAB — CBC
HCT: 26.5 % — ABNORMAL LOW (ref 39.0–52.0)
Hemoglobin: 8 g/dL — ABNORMAL LOW (ref 13.0–17.0)
MCH: 26.8 pg (ref 26.0–34.0)
MCHC: 30.2 g/dL (ref 30.0–36.0)
MCV: 88.6 fL (ref 80.0–100.0)
Platelets: 339 10*3/uL (ref 150–400)
RBC: 2.99 MIL/uL — AB (ref 4.22–5.81)
RDW: 14.7 % (ref 11.5–15.5)
WBC: 8.5 10*3/uL (ref 4.0–10.5)
nRBC: 0.2 % (ref 0.0–0.2)

## 2018-08-03 LAB — RAPID URINE DRUG SCREEN, HOSP PERFORMED
Amphetamines: NOT DETECTED
Barbiturates: NOT DETECTED
Benzodiazepines: POSITIVE — AB
COCAINE: NOT DETECTED
Opiates: NOT DETECTED
Tetrahydrocannabinol: NOT DETECTED

## 2018-08-03 LAB — HEMOGLOBIN A1C
Hgb A1c MFr Bld: 9.3 % — ABNORMAL HIGH (ref 4.8–5.6)
Mean Plasma Glucose: 220.21 mg/dL

## 2018-08-03 LAB — GLUCOSE, CAPILLARY
GLUCOSE-CAPILLARY: 216 mg/dL — AB (ref 70–99)
Glucose-Capillary: 239 mg/dL — ABNORMAL HIGH (ref 70–99)
Glucose-Capillary: 149 mg/dL — ABNORMAL HIGH (ref 70–99)

## 2018-08-03 LAB — ECHOCARDIOGRAM COMPLETE
HEIGHTINCHES: 69 in
Weight: 3040 oz

## 2018-08-03 LAB — GASTRIN: Gastrin: 269 pg/mL — ABNORMAL HIGH (ref 0–115)

## 2018-08-03 LAB — VITAMIN B12: Vitamin B-12: 2804 pg/mL — ABNORMAL HIGH (ref 180–914)

## 2018-08-03 MED ORDER — SUCRALFATE 1 GM/10ML PO SUSP: 1.0000 g | Freq: Three times a day (TID) | ORAL | 1 refills | Status: AC

## 2018-08-03 MED ORDER — POLYETHYLENE GLYCOL 3350 17 G PO PACK
17.0000 g | PACK | Freq: Two times a day (BID) | ORAL | Status: DC
  Administered 2018-08-03: 17 g via ORAL
  Filled 2018-08-03: qty 1

## 2018-08-03 MED ORDER — IOPAMIDOL (ISOVUE-370) INJECTION 76%
INTRAVENOUS | Status: AC
  Administered 2018-08-03: 75 mL
  Filled 2018-08-03: qty 100

## 2018-08-03 MED ORDER — CLOPIDOGREL BISULFATE 75 MG PO TABS: 75.0000 mg | ORAL_TABLET | Freq: Every day | ORAL | 11 refills | Status: AC

## 2018-08-03 MED ORDER — POLYETHYLENE GLYCOL 3350 17 G PO PACK: 17.0000 g | PACK | Freq: Every day | ORAL | 1 refills | Status: AC

## 2018-08-03 MED ORDER — GABAPENTIN 600 MG PO TABS: 300.0000 mg | ORAL_TABLET | Freq: Every day | ORAL | 2 refills | Status: AC

## 2018-08-03 MED ORDER — CLOPIDOGREL BISULFATE 75 MG PO TABS
75.0000 mg | ORAL_TABLET | Freq: Every day | ORAL | Status: DC
  Administered 2018-08-03: 75 mg via ORAL
  Filled 2018-08-03: qty 1

## 2018-08-03 MED ORDER — ATORVASTATIN CALCIUM 40 MG PO TABS: 40.0000 mg | ORAL_TABLET | Freq: Every evening | ORAL | 11 refills | Status: AC

## 2018-08-03 MED ORDER — ACETAMINOPHEN 325 MG PO TABS: 650.0000 mg | ORAL_TABLET | Freq: Four times a day (QID) | ORAL | 1 refills | Status: AC | PRN

## 2018-08-03 MED ORDER — PANTOPRAZOLE SODIUM 40 MG PO TBEC: 40.0000 mg | DELAYED_RELEASE_TABLET | Freq: Two times a day (BID) | ORAL | 5 refills | Status: AC

## 2018-08-03 MED ORDER — ASPIRIN 81 MG PO CHEW: 81.0000 mg | CHEWABLE_TABLET | Freq: Every day | ORAL | 2 refills | Status: AC

## 2018-08-03 MED ORDER — METFORMIN HCL 1000 MG PO TABS: 1000.0000 mg | ORAL_TABLET | Freq: Two times a day (BID) | ORAL | 2 refills | Status: AC

## 2018-08-03 MED ORDER — INSULIN REGULAR HUMAN 100 UNIT/ML IJ SOLN: 0.0000 [IU] | Freq: Three times a day (TID) | INTRAMUSCULAR | 11 refills | Status: AC

## 2018-08-03 MED ORDER — INSULIN GLARGINE 100 UNIT/ML ~~LOC~~ SOLN: 12.0000 [IU] | Freq: Every day | SUBCUTANEOUS | 11 refills | Status: AC

## 2018-08-03 NOTE — Progress Notes (Signed)
  Echocardiogram 2D Echocardiogram has been performed.  Janalyn HarderWest, Chadrick Sprinkle R 08/03/2018, 1:50 PM

## 2018-08-03 NOTE — Progress Notes (Signed)
Inpatient Diabetes Program Recommendations  AACE/ADA: New Consensus Statement on Inpatient Glycemic Control (2015)  Target Ranges:  Prepandial:   less than 140 mg/dL      Peak postprandial:   less than 180 mg/dL (1-2 hours)      Critically ill patients:  140 - 180 mg/dL   Lab Results  Component Value Date   GLUCAP 216 (H) 08/03/2018   HGBA1C 9.3 (H) 08/03/2018    Review of Glycemic Control Results for WARWICK, ROSELLO (MRN [id number]) as of 08/03/2018 11:06  Ref. Range 08/02/2018 13:14 08/02/2018 16:22 08/02/2018 21:27 08/03/2018 08:02  Glucose-Capillary Latest Ref Range: 70 - 99 mg/dL 794 (H) 801 (H) 655 (H) 216 (H)   Diabetes history:Type 2 DM Current orders for Inpatient glycemic control:Novolog 0-9 units TID, Lantus 10 units QHS  Inpatient Diabetes Program Recommendations:    Consider increasing Lantus to 16 units QHS.  Thanks, Lujean Rave, MSN, RNC-OB Diabetes Coordinator [phone] (8a-5p)

## 2018-08-03 NOTE — Evaluation (Addendum)
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 Patient did not demonstrate any overt balance losses. Recommend HHPT for higher level  Functional balance assessment in home environment.  Patient reports ambulating to BR without assist. Recommended that patient have supervision while in hospital. Pt admitted with above diagnosis. Pt currently with functional limitations due to the deficits listed below (see PT Problem List).  Pt will benefit from skilled PT to increase their independence and safety with mobility to allow discharge to the venue listed below.        Follow Up Recommendations Home health PT    Equipment Recommendations  None recommended by PT    Recommendations for Other Services   OT-ordered    Precautions / Restrictions Precautions Precautions: Fall      Mobility  Bed Mobility Overal bed mobility: Independent                Transfers Overall transfer level: Needs assistance Equipment used: None Transfers: Sit to/from Stand Sit to Stand: Supervision         General transfer comment: for safety  Ambulation/Gait Ambulation/Gait assistance: Supervision;Min guard Gait Distance (Feet): 160 Feet(then 120') Assistive device: None Gait Pattern/deviations: Drifts right/left   Gait velocity interpretation: 1.31 - 2.62 ft/sec, indicative of limited community ambulator General Gait Details: gait is slow, no balance losses but does move slower than normal, guarded. Patient does appear to get close to objects/wall,etc on Left.   Stairs             Wheelchair Mobility    Modified Rankin (Stroke Patients Only)       Balance Overall balance assessment: Needs assistance Sitting-balance support: Feet supported;No upper extremity supported Sitting balance-Leahy Scale: Normal     Standing balance support: During functional activity;No upper extremity supported Standing balance-Leahy Scale: Fair Standing balance comment: static balance normal, tuns around without loss                             Pertinent Vitals/Pain Pain Assessment: No/denies pain    Home Living Family/patient expects to be discharged to:: Private residence Living Arrangements: Spouse/significant other Available Help at Discharge: Family Type of Home: House Home Access: Ramped entrance     Home Layout: One level Home Equipment: Environmental consultantWalker - 4 wheels Additional Comments: wife just had transplant surgery.    Prior Function Level of Independence: Independent ,drives,               Hand Dominance        Extremity/Trunk Assessment   Upper Extremity Assessment Upper Extremity Assessment: Defer to OT evaluation;LUE deficits/detail LUE Deficits / Details: mild dysmetria w/ finger to nose    Lower Extremity Assessment Lower Extremity Assessment: LLE deficits/detail LLE Deficits / Details: slightly slower  RRM, gross[facility]th [facility]    Cervical / Trunk Assessment Cervical / Trunk Assessment: Normal;Other exceptions Cervical / Trunk Exceptions: left facial droop noted  Communication   Communication: No difficulties  Cognition Arousal/Alertness: Awake/alert Behavior During Therapy: WFL for tasks assessed/performed Overall Cognitive Status: Within Functional Limits for tasks assessed  General Comments      Exercises     Assessment/Plan    PT Assessment Patient needs continued PT services  PT Problem List Decreased mobility;Decreased balance;Decreased safety awareness        PT Treatment Interventions Gait training;DME instruction;Therapeutic exercise;Balance training;Functional mobility training;Therapeutic activities;Patient/family education    PT Goals (Current goals can be found in the Care Plan section)  Acute Rehab PT Goals Patient Stated Goal: to go home soon PT Goal Formulation: With patient/family Time For Goal Achievement: 08/17/18 Potential to Achieve Goals: Good    Frequency Min 4X/week   Barriers to discharge        Co-evaluation               AM-PAC PT "6 Clicks" Mobility  Outcome Measure Help needed turning from your back to your side while in a flat bed without using bedrails?: None Help needed moving from lying on your back to sitting on the side of a flat bed without using bedrails?: None Help needed moving to and from a bed to a chair (including a wheelchair)?: A Little Help needed standing up from a chair using your arms (e.g., wheelchair or bedside chair)?: A Little Help needed to walk in hospital room?: A Little Help needed climbing 3-5 steps with a railing? : A Lot 6 Click Score: 19    End of Session Equipment Utilized During Treatment: Gait belt Activity Tolerance: Patient tolerated treatment well Patient left: in bed;with bed alarm set;with call bell/phone within reach Nurse Communication: Mobility status PT Visit Diagnosis: Unsteadiness on feet (R26.81)    Time: 8299-3716 PT Time Calculation (min) (ACUTE ONLY): 21 min   Charges:   PT Evaluation $PT Eval Low Complexity: 1 Low          Blanchard Kelch PT Acute Rehabilitation Services Pager [phone] Office [phone]   Rada Hay 08/03/2018, 4:06 PM

## 2018-08-03 NOTE — Discharge Summary (Addendum)
Dakota Morton, is a 63 y.o. male  DOB Dec 08, 1955  MRN [id number].  Admission date:  07/30/2018  Admitting Physician  Ike Bene, MD  Discharge Date:  08/03/2018   Primary MD  Physicians, [facility]  Recommendations for primary care physician for things to follow:   1)Take aspirin 81 mg every morning with food along with Plavix 75 mg daily for the next 3 months, and then take Plavix alone after that 2) follow-up with [facility] gastroenterology for repeat EGD/endoscopy test in 6 weeks 3) you already taking aspirin and Plavix which are blood thinners so avoid ibuprofen/Advil/Aleve/Motrin/Goody Powders/Naproxen/BC powders/Meloxicam/Diclofenac/Indomethacin and other Nonsteroidal anti-inflammatory medications as these will make you more likely to bleed and can cause stomach ulcers, can also cause Kidney problems.  4)Follow up with [facility] [phone]) in 2 to 3  weeks for recheck  5) take all your medications including cholesterol medication as prescribed 6) do not restart metformin until Monday August 07, 2018 7) your diabetes is out of control to your insulin regimen has been adjusted 8) please call if dark, maroon, mahogany or bright red blood in your stool 9) CBC and BMP blood test with your primary care physician around Tuesday, August 08, 2018 10) follow-up with a cardiologist within the next 1 to 2 weeks for reevaluation and for pacemaker check and interrogation  Admission Diagnosis  Gastrointestinal bleed [K92.2]  Discharge Diagnosis  Gastrointestinal bleed [K92.2]    Principal Problem:   CVA (cerebral vascular accident) /Bilateral frontal infarcts Active Problems:   GI bleed   Atherosclerotic heart disease of native coronary artery without angina pectoris   Bilateral carotid artery stenosis   Diabetes mellitus (HCC)   Pacemaker reprogramming/check   SSS (sick sinus  syndrome) (HCC)   Duodenal ulcer   Acute blood loss anemia   Cerebral thrombosis with cerebral infarction     Past Medical History:  Diagnosis Date  . Atherosclerotic heart disease of native coronary artery without angina pectoris 03/21/2012   Stent 2003 Stent 2005 B TCA to right coronary artery 2003 as well as in April 2015  . Bilateral carotid artery stenosis 12/30/2015  . Carotid stenosis 05/02/2015  . Duodenal ulcer 2016  . Essential hypertension 03/21/2012  . GERD (gastroesophageal reflux disease) 03/21/2012  . SSS (sick sinus syndrome) (HCC) 05/22/2015   Past Surgical History:  Procedure Laterality Date  . BIOPSY  08/01/2018   Procedure: BIOPSY;  Surgeon: Shellia Cleverly, DO;  Location: [facility] ENDOSCOPY;  Service: Gastroenterology;;  . ESOPHAGOGASTRODUODENOSCOPY (EGD) WITH PROPOFOL N/A 08/01/2018   Procedure: ESOPHAGOGASTRODUODENOSCOPY (EGD) WITH PROPOFOL;  Surgeon: Shellia Cleverly, DO;  Location: [facility] ENDOSCOPY;  Service: Gastroenterology;  Laterality: N/A;  . HOT HEMOSTASIS N/A 08/01/2018   Procedure: HOT HEMOSTASIS (ARGON PLASMA COAGULATION/BICAP);  Surgeon: Shellia Cleverly, DO;  Location: [facility] ENDOSCOPY;  Service: Gastroenterology;  Laterality: N/A;     HPI  from the history and physical done on the day of admission:    Chief Complaint: tarry stools and syncope  HPI: Dakota Morton  is a 63 y.o. male with CAD s/p CABG, hx of sick sinus syncrome s/p PPM, bilateral carotid artery stenosis s/p R carotid stent, HTN, and prior hx of duodenal ulcer s/p IR GDA embolization (2016) who presents with tarry stools and near syncope that started 3 days ago. Patient reports that on Friday (3 days prior to this admission), he had an episode of black stools x 2 (pasty and black-colored). Then later that day and early on Saturday, patient had one episode of near fainting while ambulating and then another episode of syncope where he was found slumped against the door by his wife. His wife also noted  that immediately after he was found down, patient had a noticeable L sided lower facial droop. Patient also recalls having increased L hand grip clumsiness around the time of his syncope. Denies head trauma although fall was not witnessed -- thought that patient had slid against the doorframe. Patient presented to [facility] for given his symptoms. Hb found to 7.7 at [place] (baseline 13-14); patient was transfused 1 unit pRBC and started on PPI. He accepted for transfer on 12/29 to [facility] around 0100am for anticipated GI procedure -- due to bed availability, arrived on 07/30/18 evening.    Patient denies recent NSAID use; only takes baby aspirin daily (as prescribed). No chest pain, URI symptoms, UTI symptoms, fevers/chills. Last BM was late Friday (black and pasty stools). Has not had a BM since Friday. He reports that for the past few months, he has noticed an occasional gnawing discomfort and bloating/gas like symptoms with associated nausea post-prandially in his upper epigastric area -- patient reports relief with alka-seltzers. Currently feels mildly nauseated   Hospital Course:   Brief Summary 63 yo male with past medical history relevant for CAD s/p CABG, hx of sick sinus syncrome s/p PPM, bilateral carotid artery stenosis s/p R carotid stent, HTN, and prior hx of duodenal ulcer s/p IR GDA embolization (2016), transferred from [facility] on 07/30/2018 with melena / acute anemia / syncope. He has a hx of UGI bleeding from duodenal ulcer , s/p GDA embolization at [facility] in 2016.Marland Kitchen  EGD 08/01/18 LA Grade C esophagitis. - Normal upper third of esophagus and middle third  of esophagus. - Gastritis. Biopsied. - Normal antrum and pylorus. - Normal duodenal bulb and third portion of the  duodenum. - Multiple  non-bleeding duodenal ulcers with  pigmented material   Plan 1)Acute GI bleed--- received 1 unit of packed cells at [facility], , received additional unit of packed cells on 07/31/2018 for a total of 2 units so far, hemoglobin up to 8.0 , EGD on 08/01/2018 as above, continue Protonix twice daily and Carafate 1 g 4 times daily, repeat EGD in 6 weeks advised, CT abdomen and pelvis to evaluate for possible extra-abdominal etiology of duodenal ulcers failed to demonstrate any acute findings  2) acute blood loss anemia superimposed on chronic anemia-secondary to #1 above, results above #1  3)Lt Facial Droop/ syncope/Acute CVA-clinically acute stroke was present on admission, initial CT head did not show significant acute findings, unable to do MRI brain due to pacemaker in situ, serial CT head and CTA neck and head demonstrates bilateral frontal infarcts/ small cortical and white matter infarcts, patient also has significant stenosis of right ICA at M1, the right ICA stent is patent, patient has moderate left ICA stenosis,  known bilateral carotid artery disease with R carotid stent,  after discussions with stroke neurologist Dr. Roda Shutters and [facility] physician Dr. Gae Bon  Cirigliano--okay to give aspirin 81 mg plus Plavix 75 mg for the next 3 months, and Plavix 75 mg daily indefinitely after that.... Increased risk of GI bleed discussed with patient and wife,,  cranial imaging studies also showed evidence of old infarcts.  Left upper extremity weakness and left-sided facial droop noted, LDL 70, HDL 27, and admits that he was not compliant with simvastatin prior to admission patient is high-dose statins give Lipitor 40 mg every afternoon---- Even if his lipid panel is within desired limits, patient should still take Lipitor/Statin for it's Pleiotropic effects (beyond cholesterol lowering benefits), echo with preserved EF of 55 to 60%, no intracardiac thrombus  4)DM2-A1c is 9.3 reflecting  uncontrolled diabetes, start Lantus insulin 12 units nightly and may use use Novolog/Humalog Sliding scale insulin with Accu-Cheks/Fingersticks as ordered.... Metformin on hold until after 08/07/2018 due to contrast studies,   5)Fever and leukocytosis--- patient had transient fevers and leukocytosis now resolved, remains afebrile and blood cultures negative so far  6) history of sick sinus syndrome----status post prior pacemaker placement, pacemaker interrogation on 08/03/2018 reveals no significant abnormalities  7)H/o CAD--- stable, no ACS type symptoms, continue metoprolol, aspirin and Plavix as above for stroke, Lipitor as above  Code Status : Full code  Family Communication:    Wife at bedside   Disposition Plan  : home with home health services (PT and OT)  Consults  : GI/Neurology  Discharge Condition: stable  Follow UP  Follow-up Information    [facility]   Specialty:  Home Health Services Why:  Home health services arranged Contact information: [address] [place] [address] [phone]           Diet and Activity recommendation:  As advised  Discharge Instructions    Discharge Instructions    Call MD for:  difficulty breathing, headache or visual disturbances   Complete by:  As directed    Call MD for:  persistant dizziness or light-headedness   Complete by:  As directed    Call MD for:  persistant nausea and vomiting   Complete by:  As directed    Call MD for:  severe uncontrolled pain   Complete by:  As directed    Call MD for:  temperature >100.4   Complete by:  As directed    Diet - low sodium heart healthy   Complete by:  As directed    Diet Carb Modified   Complete by:  As directed    Discharge instructions   Complete by:  As directed    1)Take aspirin 81 mg every morning with food along with Plavix 75 mg daily for the next 3 months, and then take Plavix alone after that 2) follow-up with [facility]  gastroenterology for repeat EGD/endoscopy test in 6 weeks 3) you already taking aspirin and Plavix which are blood thinners so avoid ibuprofen/Advil/Aleve/Motrin/Goody Powders/Naproxen/BC powders/Meloxicam/Diclofenac/Indomethacin and other Nonsteroidal anti-inflammatory medications as these will make you more likely to bleed and can cause stomach ulcers, can also cause Kidney problems.  4)Follow up with [facility] [phone]) in 2 to 3  weeks for recheck  5) take all your medications including cholesterol medication as prescribed 6) do not restart metformin until Monday August 07, 2018 7) your diabetes is out of control to your insulin regimen has been adjusted 8) please call if dark, maroon, mahogany or bright red blood in your stool 9) CBC and BMP blood test with your primary care physician around Tuesday, August 08, 2018 10) follow-up with a cardiologist within the next 1 to 2 weeks for reevaluation and for pacemaker check and interrogation   Increase activity slowly   Complete by:  As directed       Discharge Medications     Allergies as of 08/03/2018      Reactions   Codeine Nausea Only      Medication List    STOP taking these medications   aspirin EC 81 MG tablet Replaced by:  aspirin 81 MG chewable tablet   gabapentin 400 MG capsule Commonly known as:  NEURONTIN Replaced by:  gabapentin 600 MG tablet   simvastatin 40 MG tablet Commonly known as:  ZOCOR     TAKE these medications   acetaminophen 325 MG tablet Commonly known as:  TYLENOL Take 2 tablets (650 mg total) by mouth every 6 (six) hours as needed for mild pain, fever or headache.   amLODipine-benazepril 10-40 MG capsule Commonly known as:  LOTREL Take 1 capsule by mouth daily.   aspirin 81 MG chewable tablet Chew 1 tablet (81 mg total) by mouth daily with breakfast. Take aspirin 81 mg every morning with food along with Plavix 75 mg daily for the next 3 months Replaces:  aspirin EC 81  MG tablet   atorvastatin 40 MG tablet Commonly known as:  LIPITOR Take 1 tablet (40 mg total) by mouth every evening.   cloNIDine 0.1 MG tablet Commonly known as:  CATAPRES Take 0.1 mg by mouth 2 (two) times daily.   clopidogrel 75 MG tablet Commonly known as:  PLAVIX Take 1 tablet (75 mg total) by mouth daily. Take aspirin 81 mg every morning with food along with Plavix 75 mg daily for the next 3 months, and then Plavix alone after that   fenofibrate 160 MG tablet Take 160 mg by mouth daily.   gabapentin 600 MG tablet Commonly known as:  NEURONTIN Take 0.5 tablets (300 mg total) by mouth at bedtime. Replaces:  gabapentin 400 MG capsule   insulin glargine 100 UNIT/ML injection Commonly known as:  LANTUS Inject 0.12 mLs (12 Units total) into the skin at bedtime.   insulin regular 100 units/mL injection Commonly known as:  NOVOLIN R,HUMULIN R Inject 0-0.15 mLs (0-15 Units total) into the skin 3 (three) times daily before meals. What changed:  how much to take   metFORMIN 1000 MG tablet Commonly known as:  GLUCOPHAGE Take 1 tablet (1,000 mg total) by mouth 2 (two) times daily with a meal. Hold onto Monday 08/07/2018 What changed:  additional instructions   metoprolol tartrate 100 MG tablet Commonly known as:  LOPRESSOR Take 50 mg by mouth 2 (two) times daily.   pantoprazole 40 MG tablet Commonly known as:  PROTONIX Take 1 tablet (40 mg total) by mouth 2 (two) times daily before a meal.   polyethylene glycol packet Commonly known as:  MIRALAX / GLYCOLAX Take 17 g by mouth daily.   sucralfate 1 GM/10ML suspension Commonly known as:  CARAFATE Take 10 mLs (1 g total) by mouth 4 (four) times daily -  with meals and at bedtime.   temazepam 15 MG capsule Commonly known as:  RESTORIL Take 15 mg by mouth at bedtime as needed for sleep.      Major procedures and Radiology Reports - PLEASE review detailed and final reports for all details, in brief -    Ct Angio Head W Or  Wo Contrast  Result Date: 08/03/2018 CLINICAL DATA:  Stroke, follow-up. Bilateral frontal  lobe subcortical infarcts. EXAM: CT ANGIOGRAPHY HEAD AND NECK TECHNIQUE: Multidetector CT imaging of the head and neck was performed using the standard protocol during bolus administration of intravenous contrast. Multiplanar CT image reconstructions and MIPs were obtained to evaluate the vascular anatomy. Carotid stenosis measurements (when applicable) are obtained utilizing NASCET criteria, using the distal internal carotid diameter as the denominator. CONTRAST:  75mL ISOVUE-370 IOPAMIDOL (ISOVUE-370) INJECTION 76% COMPARISON:  CT head without contrast 08/02/18, 07/30/2018, 07/29/18 FINDINGS: CTA NECK FINDINGS Aortic arch: A 3 vessel arch configuration is present. Atherosclerotic changes are present at the arch without a significant stenosis of the great vessel origins. Right carotid system: The right common carotid artery is within normal limits. A right carotid stent is in place. The lumen is patent through the stent. The distal right ICA demonstrates some tortuosity without significant stenosis. Left carotid system: The left common carotid artery demonstrates noncalcified plaque proximally without a significant stenosis relative to the more distal vessel. The more distal left common carotid artery is within normal limits. Calcified and noncalcified plaque present in the proximal left internal carotid artery. The lumen is narrowed 2 mm. Atherosclerotic irregularity is present in the cervical left ICA throughout its course. Minimal luminal diameter is just below the skull base, with the lumen is narrowed to 1.5 mm. Vertebral arteries: The vertebral arteries are codominant. Both vertebral arteries originate from the subclavian arteries without significant stenosis. There is moderate narrowing of the right vertebral artery at the C1-2 junction relative to the more distal vessel. No significant stenosis is present on the left.  Skeleton: Solid fusion is present at C6-7. Slight degenerative anterolisthesis is present at C3-4. Vertebral body heights are otherwise maintained. Median sternotomy is noted. Other neck: Soft tissues the neck are otherwise unremarkable. Thyroid is within normal limits. Significant adenopathy is present. Salivary glands are within normal limits. Upper chest: There is mild thickening of the scratched at there is mild proximal thickening of the esophagus. No discrete lesion is present. Upper mediastinum is within normal limits. The lung apices are clear. Review of the MIP images confirms the above findings CTA HEAD FINDINGS Anterior circulation: Atherosclerotic calcifications are present throughout the precavernous and cavernous internal carotid artery. There is a high-grade stenosis in the proximal cavernous right ICA. The distal right ICA terminus is opacified. Moderate stenosis is present in the petrous left ICA. Calcifications are present throughout the cavernous internal carotid artery without additional stenosis. ICA termini are intact bilaterally. There is mild narrowing of the proximal left M1 segment. The right A1 segment is dominant. The anterior communicating artery is patent. There is a high-grade stenosis proximal right M1 segment multiple small vessels are consistent with a developing moyamoya. Moderate diffuse branch vessel disease is present. Collaterals are present. ACA branch vessels are within normal limits bilaterally. More mild small vessel irregularity is present in the left MCA branch vessels. Posterior circulation: A moderate stenosis is present in the proximal left V3 segment with dense calcifications at the dural margin. PICA origins are visualized and normal bilaterally. The vertebrobasilar junction is normal. Basilar artery is within normal limits. Both posterior cerebral arteries originate from the basilar tip. There is moderate small vessel disease in the PCA branches without a  significant proximal stenosis otherwise. Venous sinuses: The dural sinuses are patent. Straight sinus and deep cerebral veins are intact. Cortical veins are within normal limits bilaterally. The left transverse sinus is dominant. Anatomic variants: None Delayed phase: Postcontrast images accentuate the frontal lobe infarcts. No pathologic enhancement  is present. Review of the MIP images confirms the above findings IMPRESSION: 1. 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 2. 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 3. Asymmetric decreased size of right MCA branch vessels suggesting decreased perfusion. Cordelia Pen is a are likely fed via collaterals. 4. Right ICA stent is patent. Ultrasound would be more accurate at identifying any recurrent stenosis given the metal artifact. 5. Diffuse atherosclerotic changes from the left carotid bifurcation through the skull base in the left internal carotid artery. The distal lumen is narrowed to less than 1.5 mm. 6. Moderate stenosis in the petrous left ICA. 7. Atherosclerotic changes in the left M1 segment without a significant stenosis. 8. Diffuse medium and small vessel disease within the ACA branch vessels bilaterally and left MCA branch vessels. 9. Moderate diffuse medium and small vessel disease in the posterior circulation. 10. 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 11. Atherosclerotic changes at the aortic arch without significant stenosis. 12. Degenerative changes of the cervical spine as described. 13. Bilateral frontal lobe infarcts. Electronically Signed   By: Marin Roberts M.D.   On: 08/03/2018 10:26   Ct Head Wo Contrast  Result Date: 08/02/2018 CLINICAL DATA:  Left facial droop. EXAM: CT HEAD WITHOUT CONTRAST TECHNIQUE: Contiguous axial images were obtained from the base of the skull through the vertex  without intravenous contrast. COMPARISON:  Two days ago FINDINGS: Brain: Patchy low-density along the bilateral frontal cortex subcortical white matter that appears similar to prior. These are indistinct and could be subacute. There is more chronic appearing cortical infarcts in the right parietal lobe with volume loss. There has been a remote lacunar infarct in the right centrum semiovale. No acute hemorrhage.  No evidence of interval infarct. Small arachnoid cyst in the left middle cranial fossa. Vascular: Negative Skull: Negative Sinuses/Orbits: Clear.  Bilateral mastoidectomy IMPRESSION: 1. Small cortical and white matter infarcts along the bilateral frontal convexity that may be subacute. 2. Remote appearing right parietal cortex infarcts. Remote lacunar infarct in the right corona radiata. 3. No intracranial hemorrhage or progressive finding. Electronically Signed   By: Marnee Spring M.D.   On: 08/02/2018 16:16   Ct Head Wo Contrast  Result Date: 07/30/2018 CLINICAL DATA:  Suspect transient ischemic attack. EXAM: CT HEAD WITHOUT CONTRAST TECHNIQUE: Contiguous axial images were obtained from the base of the skull through the vertex without intravenous contrast. COMPARISON:  None. FINDINGS: Brain: No evidence of acute infarction, hemorrhage, hydrocephalus, extra-axial collection or mass lesion / mass effect. Prominence of the ventricles and sulci reflects mild cortical volume loss. 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 The brainstem and fourth ventricle are within normal limits. The basal ganglia are unremarkable in appearance. No mass effect or midline shift is seen. Vascular: No hyperdense vessel or unexpected calcification. Skull: There is no evidence of fracture; visualized osseous structures are unremarkable in appearance. Sinuses/Orbits: The orbits are within normal limits. The patient is status post bilateral mastoidectomy. The  paranasal sinuses are well-aerated. Other: No significant soft tissue abnormalities are seen. IMPRESSION: 1. No acute intracranial pathology seen on CT. 2. Mild cortical volume loss. 3. 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 Electronically Signed   By: Roanna Raider M.D.   On: 07/30/2018 23:39   Ct Angio Neck W Or Wo Contrast  Result  Date: 08/03/2018 CLINICAL DATA:  Stroke, follow-up. Bilateral frontal lobe subcortical infarcts. EXAM: CT ANGIOGRAPHY HEAD AND NECK TECHNIQUE: Multidetector CT imaging of the head and neck was performed using the standard protocol during bolus administration of intravenous contrast. Multiplanar CT image reconstructions and MIPs were obtained to evaluate the vascular anatomy. Carotid stenosis measurements (when applicable) are obtained utilizing NASCET criteria, using the distal internal carotid diameter as the denominator. CONTRAST:  75mL ISOVUE-370 IOPAMIDOL (ISOVUE-370) INJECTION 76% COMPARISON:  CT head without contrast 08/02/18, 07/30/2018, 07/29/18 FINDINGS: CTA NECK FINDINGS Aortic arch: A 3 vessel arch configuration is present. Atherosclerotic changes are present at the arch without a significant stenosis of the great vessel origins. Right carotid system: The right common carotid artery is within normal limits. A right carotid stent is in place. The lumen is patent through the stent. The distal right ICA demonstrates some tortuosity without significant stenosis. Left carotid system: The left common carotid artery demonstrates noncalcified plaque proximally without a significant stenosis relative to the more distal vessel. The more distal left common carotid artery is within normal limits. Calcified and noncalcified plaque present in the proximal left internal carotid artery. The lumen is narrowed 2 mm. Atherosclerotic irregularity is present in the cervical left ICA throughout its course. Minimal luminal diameter is just  below the skull base, with the lumen is narrowed to 1.5 mm. Vertebral arteries: The vertebral arteries are codominant. Both vertebral arteries originate from the subclavian arteries without significant stenosis. There is moderate narrowing of the right vertebral artery at the C1-2 junction relative to the more distal vessel. No significant stenosis is present on the left. Skeleton: Solid fusion is present at C6-7. Slight degenerative anterolisthesis is present at C3-4. Vertebral body heights are otherwise maintained. Median sternotomy is noted. Other neck: Soft tissues the neck are otherwise unremarkable. Thyroid is within normal limits. Significant adenopathy is present. Salivary glands are within normal limits. Upper chest: There is mild thickening of the scratched at there is mild proximal thickening of the esophagus. No discrete lesion is present. Upper mediastinum is within normal limits. The lung apices are clear. Review of the MIP images confirms the above findings CTA HEAD FINDINGS Anterior circulation: Atherosclerotic calcifications are present throughout the precavernous and cavernous internal carotid artery. There is a high-grade stenosis in the proximal cavernous right ICA. The distal right ICA terminus is opacified. Moderate stenosis is present in the petrous left ICA. Calcifications are present throughout the cavernous internal carotid artery without additional stenosis. ICA termini are intact bilaterally. There is mild narrowing of the proximal left M1 segment. The right A1 segment is dominant. The anterior communicating artery is patent. There is a high-grade stenosis proximal right M1 segment multiple small vessels are consistent with a developing moyamoya. Moderate diffuse branch vessel disease is present. Collaterals are present. ACA branch vessels are within normal limits bilaterally. More mild small vessel irregularity is present in the left MCA branch vessels. Posterior circulation: A moderate  stenosis is present in the proximal left V3 segment with dense calcifications at the dural margin. PICA origins are visualized and normal bilaterally. The vertebrobasilar junction is normal. Basilar artery is within normal limits. Both posterior cerebral arteries originate from the basilar tip. There is moderate small vessel disease in the PCA branches without a significant proximal stenosis otherwise. Venous sinuses: The dural sinuses are patent. Straight sinus and deep cerebral veins are intact. Cortical veins are within normal limits bilaterally. The left transverse sinus is dominant. Anatomic variants: None Delayed phase: Postcontrast  images accentuate the frontal lobe infarcts. No pathologic enhancement is present. Review of the MIP images confirms the above findings IMPRESSION: 1. 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 2. 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 3. Asymmetric decreased size of right MCA branch vessels suggesting decreased perfusion. Cordelia Pen is a are likely fed via collaterals. 4. Right ICA stent is patent. Ultrasound would be more accurate at identifying any recurrent stenosis given the metal artifact. 5. Diffuse atherosclerotic changes from the left carotid bifurcation through the skull base in the left internal carotid artery. The distal lumen is narrowed to less than 1.5 mm. 6. Moderate stenosis in the petrous left ICA. 7. Atherosclerotic changes in the left M1 segment without a significant stenosis. 8. Diffuse medium and small vessel disease within the ACA branch vessels bilaterally and left MCA branch vessels. 9. Moderate diffuse medium and small vessel disease in the posterior circulation. 10. 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 11. Atherosclerotic changes at the aortic arch without significant stenosis. 12. Degenerative  changes of the cervical spine as described. 13. Bilateral frontal lobe infarcts. Electronically Signed   By: Marin Roberts M.D.   On: 08/03/2018 10:26   Ct Abdomen Pelvis W Contrast  Result Date: 08/01/2018 CLINICAL DATA:  Recurrent duodenal ulcers. Acute GI bleed. EXAM: CT ABDOMEN AND PELVIS WITH CONTRAST TECHNIQUE: Multidetector CT imaging of the abdomen and pelvis was performed using the standard protocol following bolus administration of intravenous contrast. CONTRAST:  OMNIPAQUE IOHEXOL 300 MG/ML  SOLN COMPARISON:  CT scan dated 05/25/2015, 05/02/2015, 09/06/2012 and 12/05/2011 FINDINGS: Lower chest: There is a tiny area of patchy infiltrate or atelectasis at the left lung base posteriorly. Heart size is normal. Pacemaker in place. No pericardial effusion. CABG. Hepatobiliary: No focal liver abnormality is seen. Status post cholecystectomy. No biliary dilatation. There is chronic air in the biliary tree. Pancreas: There is a chronic thick-walled cystic lesion on the tip of the tail of the pancreas measuring 4.6 cm, essentially unchanged since the prior study. There is a small internal septation. The remainder of the pancreas appears normal. Spleen: Normal in size without focal abnormality. Adrenals/Urinary Tract: There is a 14 mm exophytic cyst on the lower pole of the right kidney, minimally larger than on the prior study. Kidneys and adrenal glands and bladder are otherwise normal. Stomach/Bowel: The bowel appears normal including the appendix. Small surgical clip in the third portion of the duodenum. Surgical clips around the distal antrum of the stomach. Prior cholecystectomy. Vascular/Lymphatic: Aortic atherosclerosis. No enlarged abdominal or pelvic lymph nodes. Reproductive: Prostate is unremarkable. Other: No abdominal wall hernia or abnormality. No abdominopelvic ascites. Musculoskeletal: No acute or significant osseous findings. IMPRESSION: 1. Tiny area of patchy infiltrate or  atelectasis at the left lung base posteriorly. 2. Stable thick-walled cystic lesion on the tip of the tail of the pancreas of unknown etiology. Could this be a benign cystic neoplasm? Aortic Atherosclerosis (ICD10-I70.0). Electronically Signed   By: Francene Boyers M.D.   On: 08/01/2018 20:31    Micro Results   Recent Results (from the past 240 hour(s))  Culture, blood (Routine X 2) w Reflex to ID Panel     Status: None (Preliminary result)   Collection Time: 08/02/18 11:19 AM  Result Value Ref Range Status   Specimen Description BLOOD BLOOD LEFT HAND  Final   Special Requests AEROBIC BOTTLE ONLY Blood  Culture adequate volume  Final   Culture   Final    NO GROWTH 1 DAY Performed at [facility], [address] [place], [place] [address]    Report Status PENDING  Incomplete  Culture, blood (Routine X 2) w Reflex to ID Panel     Status: None (Preliminary result)   Collection Time: 08/02/18  5:10 PM  Result Value Ref Range Status   Specimen Description SITE NOT SPECIFIED  Final   Special Requests   Final    BOTTLES DRAWN AEROBIC ONLY Blood Culture adequate volume   Culture   Final    NO GROWTH < 24 HOURS Performed at [facility], [address] [place], [place] [address]    Report Status PENDING  Incomplete       Today   Subjective    Dakota MollyJames Morton today has no new concerns, no chest pains or palpitations no dizziness no further concerns about GI bleed no abdominal pain  No further fevers, no chills, no palpitations no dysuria        Patient has been seen and examined prior to discharge   Objective   Blood pressure (!) 158/71, pulse 80, temperature 98.1 F (36.7 C), resp. rate 18, height 5\' 9"  (1.753 m), weight 86.2 kg, SpO2 97 %.   Intake/Output Summary (Last 24 hours) at 08/03/2018 1843 Last data filed at 08/03/2018 0259 Gross per 24 hour  Intake -  Output 200 ml  Net -200 ml    Exam Gen:- Awake Alert,  In no apparent distress  HEENT:- [place].AT, No  sclera icterus, Lt facial droop Neck-Supple Neck,No JVD, Lt sided pacemaker in situ Lungs-  CTAB , fair symmetrical air movement CV- S1, S2 normal, regular  Abd-  +ve B.Sounds, Abd Soft, No tenderness,    Extremity/Skin:- No  edema, pedal pulses present  Psych-affect is appropriate, oriented x3 Neuro-left-sided facial droop, 4/5 strength in left upper extremity 5/5 on the right,    Data Review   CBC w Diff:  Lab Results  Component Value Date   WBC 8.5 08/03/2018   HGB 8.0 (L) 08/03/2018   HCT 26.5 (L) 08/03/2018   PLT 339 08/03/2018   LYMPHOPCT 18 07/30/2018   MONOPCT 10 07/30/2018   EOSPCT 2 07/30/2018   BASOPCT 0 07/30/2018    CMP:  Lab Results  Component Value Date   NA 138 08/03/2018   K 3.7 08/03/2018   CL 100 08/03/2018   CO2 29 08/03/2018   BUN 6 (L) 08/03/2018   CREATININE 0.80 08/03/2018   PROT 5.8 (L) 08/03/2018   ALBUMIN 2.5 (L) 08/03/2018   BILITOT 0.2 (L) 08/03/2018   ALKPHOS 51 08/03/2018   AST 11 (L) 08/03/2018   ALT 14 08/03/2018  .   Total Discharge time is about 33 minutes  Shon Haleourage Dontavious Emily M.D on 08/03/2018 at 6:43 PM  Pager---[phone]  Go to [email or web address] - password [email or web address] for contact info  [facility] - Office  [phone]

## 2018-08-03 NOTE — Care Management Note (Addendum)
Case Management Note  Patient Details  Name: Dakota Morton MRN: [id number] Date of Birth: 01-06-1956  Subjective/Objective:              GI bleed     Jamarr Persad    [phone]      Action/Plan: Transition to home with home health services to follow.  Expected Discharge Date:  08/03/18               Expected Discharge Plan:  Home w Home Health Services  In-House Referral:     Discharge planning Services  CM Consult  Post Acute Care Choice:    Choice offered to:  Patient  DME Arranged:  N/A(owns cane,walker) DME Agency:  NA  HH Arranged:  PT,OT ,RN HH Agency:  [facility]  Status of Service:  Completed, signed off  If discussed at Long Length of Stay Meetings, dates discussed:    Additional Comments:  Epifanio Lesches, RN 08/03/2018, 5:52 PM

## 2018-08-03 NOTE — Progress Notes (Signed)
Patient discharge teaching given including stroke handout, activity, diet, follow-up appoints, and medications. Patient verbalized understanding of all discharge instructions. IV access was d/c'd. Vitals are stable. Skin is intact except as charted in most recent assessments. Pt to be escorted out by NT, to be driven home by wife.

## 2018-08-03 NOTE — Discharge Instructions (Signed)
1)Take aspirin 81 mg every morning with food along with Plavix 75 mg daily for the next 3 months, and then take Plavix alone after that 2) follow-up with [facility] for repeat EGD/endoscopy test in 6 weeks 3) you already taking aspirin and Plavix which are blood thinners so avoid ibuprofen/Advil/Aleve/Motrin/Goody Powders/Naproxen/BC powders/Meloxicam/Diclofenac/Indomethacin and other Nonsteroidal anti-inflammatory medications as these will make you more likely to bleed and can cause stomach ulcers, can also cause Kidney problems.  4)[facility] with [facility] [phone]) in 2 to 3  weeks for recheck  5) take all your medications including cholesterol medication as prescribed 6) do not restart metformin until Monday August 07, 2018 7) your diabetes is out of control to your insulin regimen has been adjusted 8) please call if dark, maroon, mahogany or bright red blood in your stool 9) CBC and BMP blood test with your primary care physician around Tuesday, August 08, 2018 10) follow-up with a cardiologist within the next 1 to 2 weeks for reevaluation and for pacemaker check and interrogation

## 2018-08-03 NOTE — Progress Notes (Signed)
[facility] Progress Note   Chief Complaint:   GI bleed    SUBJECTIVE:    no complaints. No BM in several days.   ASSESSMENT AND PLAN:   1. Melena / acute anemia with syncope. EGD >> LA grade C esophagitis , a few nonbleeding cratered ulcers in D2. Two of the ulcers contained high risk stigmata and were treated with clip, probe and epi injections. Hgb  -gastrin level pending ( hx of UGI bleed secondary to ulcers in 2016, s/p GDA embolization) -upon discharge, continue protonix 40 mg BID x 8 weeks -Carafate x 2 weeks -Repeat EGD in 6 weeks to document healing.  -biopsies from this EGD pending. -Neurology adding Plavix to asa, see #3.    2. Abnormal pancreas on CT scan >> thick walled cyst in tail, unchanged from prior study.  -consider outpatient EUS, we have a message into our outpatient biliary team   3. Left sided weakness, facial droop. CT scan yesterday suggests subacute stroke. Neurology planning on adding plavix to ASA, unfortunately this will increase risk of recurrent GI bleeding  4. Constipation.  -start Miralax BID for now. When bowels moving can decrease to q day  OBJECTIVE:     Vital signs in last 24 hours: Temp:  [98.2 F (36.8 C)-99.2 F (37.3 C)] 98.9 F (37.2 C) (01/02 0523) Pulse Rate:  [79-83] 83 (01/02 0523) Resp:  [16-17] 17 (01/02 0523) BP: (147-159)/(72-89) 147/73 (01/02 0523) SpO2:  [95 %-100 %] 97 % (01/02 0523) Last BM Date: 08/01/18 General:   Alert, well-developed male in NAD EENT:  Normal hearing, non icteric sclera, conjunctive pink.  Heart:  Regular rate and rhythm.  No lower extremity edema   Pulm: Normal respiratory effort, lungs CTA bilaterally without wheezes or crackles. Abdomen:  Soft, nondistended, nontender.  Normal bowel sounds, no masses felt.       Neurologic:  Alert and  oriented x4 Psych:  Pleasant, cooperative.  Normal mood and affect.   Intake/Output from previous day: 01/01 0701 - 01/02 0700 In:  -  Out: 650 [Urine:650] Intake/Output this shift: No intake/output data recorded.  Lab Results: Recent Labs    08/01/18 0341 08/02/18 0411 08/03/18 0321  WBC 8.6 13.5* 8.5  HGB 8.6* 8.3* 8.0*  HCT 28.3* 27.2* 26.5*  PLT 232 304 339   BMET Recent Labs    08/01/18 0341 08/02/18 0411 08/03/18 0321  NA 138 136 138  K 4.2 3.8 3.7  CL 101 100 100  CO2 24 28 29   GLUCOSE 227* 191* 225*  BUN 9 8 6*  CREATININE 0.65 0.85 0.80  CALCIUM 9.0 8.5* 8.9   LFT Recent Labs    08/03/18 0321  PROT 5.8*  ALBUMIN 2.5*  AST 11*  ALT 14  ALKPHOS 51  BILITOT 0.2*   PT/INR No results for input(s): LABPROT, INR in the last 72 hours. Hepatitis Panel No results for input(s): HEPBSAG, HCVAB, HEPAIGM, HEPBIGM in the last 72 hours.  Ct Angio Head W Or Wo Contrast  Result Date: 08/03/2018 CLINICAL DATA:  Stroke, follow-up. Bilateral frontal lobe subcortical infarcts. EXAM: CT ANGIOGRAPHY HEAD AND NECK TECHNIQUE: Multidetector CT imaging of the head and neck was performed using the standard protocol during bolus administration of intravenous contrast. Multiplanar CT image reconstructions and MIPs were obtained to evaluate the vascular anatomy. Carotid stenosis measurements (when applicable) are obtained utilizing NASCET criteria, using the distal internal carotid diameter as the denominator. CONTRAST:  36mL ISOVUE-370 IOPAMIDOL (ISOVUE-370) INJECTION 76% COMPARISON:  CT head without contrast 08/02/18, 07/30/2018, 07/29/18 FINDINGS: CTA NECK FINDINGS Aortic arch: A 3 vessel arch configuration is present. Atherosclerotic changes are present at the arch without a significant stenosis of the great vessel origins. Right carotid system: The right common carotid artery is within normal limits. A right carotid stent is in place. The lumen is patent through the stent. The distal right ICA demonstrates some tortuosity without significant stenosis. Left carotid system: The left common carotid artery demonstrates  noncalcified plaque proximally without a significant stenosis relative to the more distal vessel. The more distal left common carotid artery is within normal limits. Calcified and noncalcified plaque present in the proximal left internal carotid artery. The lumen is narrowed 2 mm. Atherosclerotic irregularity is present in the cervical left ICA throughout its course. Minimal luminal diameter is just below the skull base, with the lumen is narrowed to 1.5 mm. Vertebral arteries: The vertebral arteries are codominant. Both vertebral arteries originate from the subclavian arteries without significant stenosis. There is moderate narrowing of the right vertebral artery at the C1-2 junction relative to the more distal vessel. No significant stenosis is present on the left. Skeleton: Solid fusion is present at C6-7. Slight degenerative anterolisthesis is present at C3-4. Vertebral body heights are otherwise maintained. Median sternotomy is noted. Other neck: Soft tissues the neck are otherwise unremarkable. Thyroid is within normal limits. Significant adenopathy is present. Salivary glands are within normal limits. Upper chest: There is mild thickening of the scratched at there is mild proximal thickening of the esophagus. No discrete lesion is present. Upper mediastinum is within normal limits. The lung apices are clear. Review of the MIP images confirms the above findings CTA HEAD FINDINGS Anterior circulation: Atherosclerotic calcifications are present throughout the precavernous and cavernous internal carotid artery. There is a high-grade stenosis in the proximal cavernous right ICA. The distal right ICA terminus is opacified. Moderate stenosis is present in the petrous left ICA. Calcifications are present throughout the cavernous internal carotid artery without additional stenosis. ICA termini are intact bilaterally. There is mild narrowing of the proximal left M1 segment. The right A1 segment is dominant. The  anterior communicating artery is patent. There is a high-grade stenosis proximal right M1 segment multiple small vessels are consistent with a developing moyamoya. Moderate diffuse branch vessel disease is present. Collaterals are present. ACA branch vessels are within normal limits bilaterally. More mild small vessel irregularity is present in the left MCA branch vessels. Posterior circulation: A moderate stenosis is present in the proximal left V3 segment with dense calcifications at the dural margin. PICA origins are visualized and normal bilaterally. The vertebrobasilar junction is normal. Basilar artery is within normal limits. Both posterior cerebral arteries originate from the basilar tip. There is moderate small vessel disease in the PCA branches without a significant proximal stenosis otherwise. Venous sinuses: The dural sinuses are patent. Straight sinus and deep cerebral veins are intact. Cortical veins are within normal limits bilaterally. The left transverse sinus is dominant. Anatomic variants: None Delayed phase: Postcontrast images accentuate the frontal lobe infarcts. No pathologic enhancement is present. Review of the MIP images confirms the above findings IMPRESSION: 1. 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 2. 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 3. Asymmetric decreased size of right MCA branch vessels suggesting decreased perfusion. Dakota Morton is a are likely fed via collaterals. 4. Right ICA stent is  patent. Ultrasound would be more accurate at identifying any recurrent stenosis given the metal artifact. 5. Diffuse atherosclerotic changes from the left carotid bifurcation through the skull base in the left internal carotid artery. The distal lumen is narrowed to less than 1.5 mm. 6. Moderate stenosis in the petrous left ICA. 7. Atherosclerotic  changes in the left M1 segment without a significant stenosis. 8. Diffuse medium and small vessel disease within the ACA branch vessels bilaterally and left MCA branch vessels. 9. Moderate diffuse medium and small vessel disease in the posterior circulation. 10. 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 11. Atherosclerotic changes at the aortic arch without significant stenosis. 12. Degenerative changes of the cervical spine as described. 13. Bilateral frontal lobe infarcts. Electronically Signed   By: Marin Roberts M.D.   On: 08/03/2018 10:26   Ct Head Wo Contrast  Result Date: 08/02/2018 CLINICAL DATA:  Left facial droop. EXAM: CT HEAD WITHOUT CONTRAST TECHNIQUE: Contiguous axial images were obtained from the base of the skull through the vertex without intravenous contrast. COMPARISON:  Two days ago FINDINGS: Brain: Patchy low-density along the bilateral frontal cortex subcortical white matter that appears similar to prior. These are indistinct and could be subacute. There is more chronic appearing cortical infarcts in the right parietal lobe with volume loss. There has been a remote lacunar infarct in the right centrum semiovale. No acute hemorrhage.  No evidence of interval infarct. Small arachnoid cyst in the left middle cranial fossa. Vascular: Negative Skull: Negative Sinuses/Orbits: Clear.  Bilateral mastoidectomy IMPRESSION: 1. Small cortical and white matter infarcts along the bilateral frontal convexity that may be subacute. 2. Remote appearing right parietal cortex infarcts. Remote lacunar infarct in the right corona radiata. 3. No intracranial hemorrhage or progressive finding. Electronically Signed   By: Marnee Spring M.D.   On: 08/02/2018 16:16   Ct Angio Neck W Or Wo Contrast  Result Date: 08/03/2018 CLINICAL DATA:  Stroke, follow-up. Bilateral frontal lobe subcortical infarcts. EXAM: CT ANGIOGRAPHY HEAD AND NECK TECHNIQUE: Multidetector CT imaging of the head and neck was  performed using the standard protocol during bolus administration of intravenous contrast. Multiplanar CT image reconstructions and MIPs were obtained to evaluate the vascular anatomy. Carotid stenosis measurements (when applicable) are obtained utilizing NASCET criteria, using the distal internal carotid diameter as the denominator. CONTRAST:  105mL ISOVUE-370 IOPAMIDOL (ISOVUE-370) INJECTION 76% COMPARISON:  CT head without contrast 08/02/18, 07/30/2018, 07/29/18 FINDINGS: CTA NECK FINDINGS Aortic arch: A 3 vessel arch configuration is present. Atherosclerotic changes are present at the arch without a significant stenosis of the great vessel origins. Right carotid system: The right common carotid artery is within normal limits. A right carotid stent is in place. The lumen is patent through the stent. The distal right ICA demonstrates some tortuosity without significant stenosis. Left carotid system: The left common carotid artery demonstrates noncalcified plaque proximally without a significant stenosis relative to the more distal vessel. The more distal left common carotid artery is within normal limits. Calcified and noncalcified plaque present in the proximal left internal carotid artery. The lumen is narrowed 2 mm. Atherosclerotic irregularity is present in the cervical left ICA throughout its course. Minimal luminal diameter is just below the skull base, with the lumen is narrowed to 1.5 mm. Vertebral arteries: The vertebral arteries are codominant. Both vertebral arteries originate from the subclavian arteries without significant stenosis. There is moderate narrowing of the right vertebral artery at the C1-2 junction relative to the more distal vessel. No significant  stenosis is present on the left. Skeleton: Solid fusion is present at C6-7. Slight degenerative anterolisthesis is present at C3-4. Vertebral body heights are otherwise maintained. Median sternotomy is noted. Other neck: Soft tissues the neck are  otherwise unremarkable. Thyroid is within normal limits. Significant adenopathy is present. Salivary glands are within normal limits. Upper chest: There is mild thickening of the scratched at there is mild proximal thickening of the esophagus. No discrete lesion is present. Upper mediastinum is within normal limits. The lung apices are clear. Review of the MIP images confirms the above findings CTA HEAD FINDINGS Anterior circulation: Atherosclerotic calcifications are present throughout the precavernous and cavernous internal carotid artery. There is a high-grade stenosis in the proximal cavernous right ICA. The distal right ICA terminus is opacified. Moderate stenosis is present in the petrous left ICA. Calcifications are present throughout the cavernous internal carotid artery without additional stenosis. ICA termini are intact bilaterally. There is mild narrowing of the proximal left M1 segment. The right A1 segment is dominant. The anterior communicating artery is patent. There is a high-grade stenosis proximal right M1 segment multiple small vessels are consistent with a developing moyamoya. Moderate diffuse branch vessel disease is present. Collaterals are present. ACA branch vessels are within normal limits bilaterally. More mild small vessel irregularity is present in the left MCA branch vessels. Posterior circulation: A moderate stenosis is present in the proximal left V3 segment with dense calcifications at the dural margin. PICA origins are visualized and normal bilaterally. The vertebrobasilar junction is normal. Basilar artery is within normal limits. Both posterior cerebral arteries originate from the basilar tip. There is moderate small vessel disease in the PCA branches without a significant proximal stenosis otherwise. Venous sinuses: The dural sinuses are patent. Straight sinus and deep cerebral veins are intact. Cortical veins are within normal limits bilaterally. The left transverse sinus is  dominant. Anatomic variants: None Delayed phase: Postcontrast images accentuate the frontal lobe infarcts. No pathologic enhancement is present. Review of the MIP images confirms the above findings IMPRESSION: 1. 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 2. 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 3. Asymmetric decreased size of right MCA branch vessels suggesting decreased perfusion. Dakota Morton is a are likely fed via collaterals. 4. Right ICA stent is patent. Ultrasound would be more accurate at identifying any recurrent stenosis given the metal artifact. 5. Diffuse atherosclerotic changes from the left carotid bifurcation through the skull base in the left internal carotid artery. The distal lumen is narrowed to less than 1.5 mm. 6. Moderate stenosis in the petrous left ICA. 7. Atherosclerotic changes in the left M1 segment without a significant stenosis. 8. Diffuse medium and small vessel disease within the ACA branch vessels bilaterally and left MCA branch vessels. 9. Moderate diffuse medium and small vessel disease in the posterior circulation. 10. 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 11. Atherosclerotic changes at the aortic arch without significant stenosis. 12. Degenerative changes of the cervical spine as described. 13. Bilateral frontal lobe infarcts. Electronically Signed   By: Marin Robertshristopher  Mattern M.D.   On: 08/03/2018 10:26   Ct Abdomen Pelvis W Contrast  Result Date: 08/01/2018 CLINICAL DATA:  Recurrent duodenal ulcers. Acute GI bleed. EXAM: CT ABDOMEN AND PELVIS WITH CONTRAST TECHNIQUE: Multidetector CT imaging of the abdomen and pelvis was performed using the standard protocol following bolus administration of intravenous contrast. CONTRAST:  100mL  OMNIPAQUE IOHEXOL 300 MG/ML  SOLN COMPARISON:  CT scan dated 05/25/2015, 05/02/2015,  09/06/2012 and 12/05/2011 FINDINGS: Lower chest: There is a tiny area of patchy infiltrate or atelectasis at the left lung base posteriorly. Heart size is normal. Pacemaker in place. No pericardial effusion. CABG. Hepatobiliary: No focal liver abnormality is seen. Status post cholecystectomy. No biliary dilatation. There is chronic air in the biliary tree. Pancreas: There is a chronic thick-walled cystic lesion on the tip of the tail of the pancreas measuring 4.6 cm, essentially unchanged since the prior study. There is a small internal septation. The remainder of the pancreas appears normal. Spleen: Normal in size without focal abnormality. Adrenals/Urinary Tract: There is a 14 mm exophytic cyst on the lower pole of the right kidney, minimally larger than on the prior study. Kidneys and adrenal glands and bladder are otherwise normal. Stomach/Bowel: The bowel appears normal including the appendix. Small surgical clip in the third portion of the duodenum. Surgical clips around the distal antrum of the stomach. Prior cholecystectomy. Vascular/Lymphatic: Aortic atherosclerosis. No enlarged abdominal or pelvic lymph nodes. Reproductive: Prostate is unremarkable. Other: No abdominal wall hernia or abnormality. No abdominopelvic ascites. Musculoskeletal: No acute or significant osseous findings. IMPRESSION: 1. Tiny area of patchy infiltrate or atelectasis at the left lung base posteriorly. 2. Stable thick-walled cystic lesion on the tip of the tail of the pancreas of unknown etiology. Could this be a benign cystic neoplasm? Aortic Atherosclerosis (ICD10-I70.0). Electronically Signed   By: Francene Boyers M.D.   On: 08/01/2018 20:31     Active Problems:   GI bleed   Duodenal ulcer   Acute blood loss anemia   Cerebral thrombosis with cerebral infarction     LOS: 4 days   Willette Cluster ,NP 08/03/2018, 10:44 AM

## 2018-08-03 NOTE — Progress Notes (Addendum)
STROKE TEAM PROGRESS NOTE   INTERVAL HISTORY No family is at the bedside.  Patient sitting up on the chair.  He has been providing care for his wife who has recently had a liver and kidney transplant at [facility].  She was just released from the [facility] area last week on Thursday 07/27/2018 when his symptoms were first noted.  He likely had unrecognized stroke on admission for upper GI bleed and syncope given symptoms of left facial weakness and left hand clumsiness.  He is anxious to return home to care for her.  Patient with significant intracranial atherosclerosis with recommended dual antiplatelet therapy, which is complicated given history of duodenal ulcers, anemia and transfusions this admission.  Vitals:   08/02/18 0809 08/02/18 1637 08/02/18 2127 08/03/18 0523  BP: 135/65 (!) 155/89 (!) 159/72 (!) 147/73  Pulse: 85 83 79 83  Resp:  16 17 17   Temp:  98.2 F (36.8 C) 99.2 F (37.3 C) 98.9 F (37.2 C)  TempSrc:  Oral Oral Oral  SpO2:  100% 95% 97%  Weight:      Height:        CBC:  Recent Labs  Lab 07/30/18 1851  08/02/18 0411 08/03/18 0321  WBC 10.4   < > 13.5* 8.5  NEUTROABS 7.0  --   --   --   HGB 7.3*   < > 8.3* 8.0*  HCT 24.2*   < > 27.2* 26.5*  MCV 89.6   < > 88.0 88.6  PLT 293   < > 304 339   < > = values in this interval not displayed.    Basic Metabolic Panel:  Recent Labs  Lab 07/30/18 1851  08/02/18 0411 08/03/18 0321  NA 135   < > 136 138  K 4.0   < > 3.8 3.7  CL 98   < > 100 100  CO2 24   < > 28 29  GLUCOSE 226*   < > 191* 225*  BUN 14   < > 8 6*  CREATININE 0.78   < > 0.85 0.80  CALCIUM 8.3*   < > 8.5* 8.9  MG 1.7  --   --   --    < > = values in this interval not displayed.   Lipid Panel:     Component Value Date/Time   CHOL 125 08/03/2018 0715   TRIG 141 08/03/2018 0715   HDL 27 (L) 08/03/2018 0715   CHOLHDL 4.6 08/03/2018 0715   VLDL 28 08/03/2018 0715   LDLCALC 70 08/03/2018 0715   HgbA1c:  Lab Results  Component Value Date   HGBA1C  9.3 (H) 08/03/2018   Urine Drug Screen: No results found for: LABOPIA, COCAINSCRNUR, LABBENZ, AMPHETMU, THCU, LABBARB  Alcohol Level No results found for: ETH  IMAGING Ct Angio Head W Or Wo Contrast  Result Date: 08/03/2018 CLINICAL DATA:  Stroke, follow-up. Bilateral frontal lobe subcortical infarcts. EXAM: CT ANGIOGRAPHY HEAD AND NECK TECHNIQUE: Multidetector CT imaging of the head and neck was performed using the standard protocol during bolus administration of intravenous contrast. Multiplanar CT image reconstructions and MIPs were obtained to evaluate the vascular anatomy. Carotid stenosis measurements (when applicable) are obtained utilizing NASCET criteria, using the distal internal carotid diameter as the denominator. CONTRAST:  75mL ISOVUE-370 IOPAMIDOL (ISOVUE-370) INJECTION 76% COMPARISON:  CT head without contrast 08/02/18, 07/30/2018, 07/29/18 FINDINGS: CTA NECK FINDINGS Aortic arch: A 3 vessel arch configuration is present. Atherosclerotic changes are present at the arch without a significant stenosis  of the great vessel origins. Right carotid system: The right common carotid artery is within normal limits. A right carotid stent is in place. The lumen is patent through the stent. The distal right ICA demonstrates some tortuosity without significant stenosis. Left carotid system: The left common carotid artery demonstrates noncalcified plaque proximally without a significant stenosis relative to the more distal vessel. The more distal left common carotid artery is within normal limits. Calcified and noncalcified plaque present in the proximal left internal carotid artery. The lumen is narrowed 2 mm. Atherosclerotic irregularity is present in the cervical left ICA throughout its course. Minimal luminal diameter is just below the skull base, with the lumen is narrowed to 1.5 mm. Vertebral arteries: The vertebral arteries are codominant. Both vertebral arteries originate from the subclavian arteries  without significant stenosis. There is moderate narrowing of the right vertebral artery at the C1-2 junction relative to the more distal vessel. No significant stenosis is present on the left. Skeleton: Solid fusion is present at C6-7. Slight degenerative anterolisthesis is present at C3-4. Vertebral body heights are otherwise maintained. Median sternotomy is noted. Other neck: Soft tissues the neck are otherwise unremarkable. Thyroid is within normal limits. Significant adenopathy is present. Salivary glands are within normal limits. Upper chest: There is mild thickening of the scratched at there is mild proximal thickening of the esophagus. No discrete lesion is present. Upper mediastinum is within normal limits. The lung apices are clear. Review of the MIP images confirms the above findings CTA HEAD FINDINGS Anterior circulation: Atherosclerotic calcifications are present throughout the precavernous and cavernous internal carotid artery. There is a high-grade stenosis in the proximal cavernous right ICA. The distal right ICA terminus is opacified. Moderate stenosis is present in the petrous left ICA. Calcifications are present throughout the cavernous internal carotid artery without additional stenosis. ICA termini are intact bilaterally. There is mild narrowing of the proximal left M1 segment. The right A1 segment is dominant. The anterior communicating artery is patent. There is a high-grade stenosis proximal right M1 segment multiple small vessels are consistent with a developing moyamoya. Moderate diffuse branch vessel disease is present. Collaterals are present. ACA branch vessels are within normal limits bilaterally. More mild small vessel irregularity is present in the left MCA branch vessels. Posterior circulation: A moderate stenosis is present in the proximal left V3 segment with dense calcifications at the dural margin. PICA origins are visualized and normal bilaterally. The vertebrobasilar junction is  normal. Basilar artery is within normal limits. Both posterior cerebral arteries originate from the basilar tip. There is moderate small vessel disease in the PCA branches without a significant proximal stenosis otherwise. Venous sinuses: The dural sinuses are patent. Straight sinus and deep cerebral veins are intact. Cortical veins are within normal limits bilaterally. The left transverse sinus is dominant. Anatomic variants: None Delayed phase: Postcontrast images accentuate the frontal lobe infarcts. No pathologic enhancement is present. Review of the MIP images confirms the above findings IMPRESSION: 1. 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 2. 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 3. Asymmetric decreased size of right MCA branch vessels suggesting decreased perfusion. Cordelia Pen is a are likely fed via collaterals. 4. Right ICA stent is patent. Ultrasound would be more accurate at identifying any recurrent stenosis given the metal artifact. 5. Diffuse atherosclerotic changes from the left carotid bifurcation through the skull base in the left  internal carotid artery. The distal lumen is narrowed to less than 1.5 mm. 6. Moderate stenosis in the petrous left ICA. 7. Atherosclerotic changes in the left M1 segment without a significant stenosis. 8. Diffuse medium and small vessel disease within the ACA branch vessels bilaterally and left MCA branch vessels. 9. Moderate diffuse medium and small vessel disease in the posterior circulation. 10. 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 11. Atherosclerotic changes at the aortic arch without significant stenosis. 12. Degenerative changes of the cervical spine as described. 13. Bilateral frontal lobe infarcts. Electronically Signed   By: Marin Robertshristopher  Mattern M.D.   On: 08/03/2018 10:26   Ct Head Wo  Contrast  Result Date: 08/02/2018 CLINICAL DATA:  Left facial droop. EXAM: CT HEAD WITHOUT CONTRAST TECHNIQUE: Contiguous axial images were obtained from the base of the skull through the vertex without intravenous contrast. COMPARISON:  Two days ago FINDINGS: Brain: Patchy low-density along the bilateral frontal cortex subcortical white matter that appears similar to prior. These are indistinct and could be subacute. There is more chronic appearing cortical infarcts in the right parietal lobe with volume loss. There has been a remote lacunar infarct in the right centrum semiovale. No acute hemorrhage.  No evidence of interval infarct. Small arachnoid cyst in the left middle cranial fossa. Vascular: Negative Skull: Negative Sinuses/Orbits: Clear.  Bilateral mastoidectomy IMPRESSION: 1. Small cortical and white matter infarcts along the bilateral frontal convexity that may be subacute. 2. Remote appearing right parietal cortex infarcts. Remote lacunar infarct in the right corona radiata. 3. No intracranial hemorrhage or progressive finding. Electronically Signed   By: Marnee SpringJonathon  Watts M.D.   On: 08/02/2018 16:16   Ct Angio Neck W Or Wo Contrast  Result Date: 08/03/2018 CLINICAL DATA:  Stroke, follow-up. Bilateral frontal lobe subcortical infarcts. EXAM: CT ANGIOGRAPHY HEAD AND NECK TECHNIQUE: Multidetector CT imaging of the head and neck was performed using the standard protocol during bolus administration of intravenous contrast. Multiplanar CT image reconstructions and MIPs were obtained to evaluate the vascular anatomy. Carotid stenosis measurements (when applicable) are obtained utilizing NASCET criteria, using the distal internal carotid diameter as the denominator. CONTRAST:  75mL ISOVUE-370 IOPAMIDOL (ISOVUE-370) INJECTION 76% COMPARISON:  CT head without contrast 08/02/18, 07/30/2018, 07/29/18 FINDINGS: CTA NECK FINDINGS Aortic arch: A 3 vessel arch configuration is present. Atherosclerotic changes are  present at the arch without a significant stenosis of the great vessel origins. Right carotid system: The right common carotid artery is within normal limits. A right carotid stent is in place. The lumen is patent through the stent. The distal right ICA demonstrates some tortuosity without significant stenosis. Left carotid system: The left common carotid artery demonstrates noncalcified plaque proximally without a significant stenosis relative to the more distal vessel. The more distal left common carotid artery is within normal limits. Calcified and noncalcified plaque present in the proximal left internal carotid artery. The lumen is narrowed 2 mm. Atherosclerotic irregularity is present in the cervical left ICA throughout its course. Minimal luminal diameter is just below the skull base, with the lumen is narrowed to 1.5 mm. Vertebral arteries: The vertebral arteries are codominant. Both vertebral arteries originate from the subclavian arteries without significant stenosis. There is moderate narrowing of the right vertebral artery at the C1-2 junction relative to the more distal vessel. No significant stenosis is present on the left. Skeleton: Solid fusion is present at C6-7. Slight degenerative anterolisthesis is present at C3-4. Vertebral body heights are otherwise maintained. Median sternotomy is noted. Other  neck: Soft tissues the neck are otherwise unremarkable. Thyroid is within normal limits. Significant adenopathy is present. Salivary glands are within normal limits. Upper chest: There is mild thickening of the scratched at there is mild proximal thickening of the esophagus. No discrete lesion is present. Upper mediastinum is within normal limits. The lung apices are clear. Review of the MIP images confirms the above findings CTA HEAD FINDINGS Anterior circulation: Atherosclerotic calcifications are present throughout the precavernous and cavernous internal carotid artery. There is a high-grade stenosis  in the proximal cavernous right ICA. The distal right ICA terminus is opacified. Moderate stenosis is present in the petrous left ICA. Calcifications are present throughout the cavernous internal carotid artery without additional stenosis. ICA termini are intact bilaterally. There is mild narrowing of the proximal left M1 segment. The right A1 segment is dominant. The anterior communicating artery is patent. There is a high-grade stenosis proximal right M1 segment multiple small vessels are consistent with a developing moyamoya. Moderate diffuse branch vessel disease is present. Collaterals are present. ACA branch vessels are within normal limits bilaterally. More mild small vessel irregularity is present in the left MCA branch vessels. Posterior circulation: A moderate stenosis is present in the proximal left V3 segment with dense calcifications at the dural margin. PICA origins are visualized and normal bilaterally. The vertebrobasilar junction is normal. Basilar artery is within normal limits. Both posterior cerebral arteries originate from the basilar tip. There is moderate small vessel disease in the PCA branches without a significant proximal stenosis otherwise. Venous sinuses: The dural sinuses are patent. Straight sinus and deep cerebral veins are intact. Cortical veins are within normal limits bilaterally. The left transverse sinus is dominant. Anatomic variants: None Delayed phase: Postcontrast images accentuate the frontal lobe infarcts. No pathologic enhancement is present. Review of the MIP images confirms the above findings IMPRESSION: 1. 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 2. 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 3. Asymmetric decreased size of right MCA branch vessels suggesting decreased perfusion. Cordelia Pen is a are likely fed via collaterals. 4.  Right ICA stent is patent. Ultrasound would be more accurate at identifying any recurrent stenosis given the metal artifact. 5. Diffuse atherosclerotic changes from the left carotid bifurcation through the skull base in the left internal carotid artery. The distal lumen is narrowed to less than 1.5 mm. 6. Moderate stenosis in the petrous left ICA. 7. Atherosclerotic changes in the left M1 segment without a significant stenosis. 8. Diffuse medium and small vessel disease within the ACA branch vessels bilaterally and left MCA branch vessels. 9. Moderate diffuse medium and small vessel disease in the posterior circulation. 10. 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 11. Atherosclerotic changes at the aortic arch without significant stenosis. 12. Degenerative changes of the cervical spine as described. 13. Bilateral frontal lobe infarcts. Electronically Signed   By: Marin Roberts M.D.   On: 08/03/2018 10:26   2D echocardiogram Left ventricle: The cavity size was normal. Wall thickness was   increased in a pattern of severe LVH. Systolic function was   normal. The estimated ejection fraction was in the range of 55%   to 60%. Wall motion was normal; there were no regional wall   motion abnormalities. Doppler parameters are consistent with   abnormal left ventricular relaxation (grade 1 diastolic   dysfunction). - Aortic root: The aortic root was mildly dilated. -  Mitral valve: Calcified annulus. Mildly thickened leaflets .  Impressions:  - Normal LV systolic function; severe LVH; mild diastolic   dysfunction; calcified aortic valve with no AS; mildly dilated   aortic root.   PHYSICAL EXAM HEENT-  [place]/AT   Lungs- Respirations unlabored Extremities- Warm and well perfused  Neurological Examination Mental Status: Awake and alert, fully oriented, thought content appropriate.  Speech fluent with intact comprehension and naming.  Able to follow all commands without  difficulty. Cranial Nerves: II: Visual fields intact with no extinction to DSS. PERRL.   III,IV, VI: No ptosis. EOMI without nystagmus.  V,VII: Mild left facial droop. Temp sensation equal bilaterally.  VIII: Hearing intact to voice.  IX,X: No hypophonia or hoarseness XI: Weak shoulder shrug on the left.  XII: Midline tongue extension Motor: LUE: 4/5 weakness left grip and biceps, otherwise 5/5 LLE: 4/5 hip flexion and knee extension RUE: 5/5 RLE: 5/5 Normal tone throughout; no atrophy noted Has mild left upper extremity drift  Sensory: Temp and light touch intact all 4 extremities. No extinction.  Cerebellar: No ataxia with FNF bilaterally  Gait: Deferred   ASSESSMENT/PLAN Dakota Morton is a 63 y.o. male with history of CAD s/p stents, bilateral carotid artery stenosis s/p right carotid stent, HTN, hx GDA embolization at [facility] in 2016 and sick sinus syndrome s/p PPM admitted 12/29 with tarry stools and syncope. He had L lower facial droop and L hand cluminess since the spell. intial CT with old strokes. Hgb 7.7, transfused, EGD w/ nonbleeding ulcers. Follow up CT confirms stroke that was present on admission.   Stroke:  R high frontoparietal infarct, now subacute, in setting of right M1 occlusion, infarct secondary to this large vessel disease source  CT head 12/29 No acute stroke. Mild atrophy. Old sm R CR and B high frontoparietal infarcts.   CT head 1/1 sm cortical and white matter infarcts B frontal convexity. Old R parietal cortex infarcts. Old   R CR infarct. (R frontal infarct seen 12/29 looks to have changed since, no change in infarcts on L side, making [place] think the R frontal is subacute)  CTA head & neck High-grade tandem stenoses cavernous R ICA. blush at R ICA terminus and proximal R M1 segment c/w moyamoya. decreased size of R MCA branch vessels. R ICA stent is patent. Diffuse atherosclerotic changes. Mod stenosis petrous L ICA. Mod left V3 stenosis. Degenerative CS. B  frontal infarcts.   2D Echo EF 55 to 60%.  Severe LVH.  No embolus seen  Pacer interrogation revealed no significant abnormalities although this is  LDL 70  HgbA1c 9.3  SCDs for VTE prophylaxis  Diet - full liq   aspirin 81 mg daily prior to admission, now on aspirin 81 mg daily and clopidogrel 75 mg daily.  Recommend continuation of aspirin and Plavix for 3 months then Plavix alone (if ok with medicine from risk of bleeding standpoint)  Therapy recommendations: Home health PT  Disposition: Pending  Hypertension  Stable . Long-term BP goal normotensive  Hyperlipidemia  Home meds:  zocor 40, resumed in hospital  LDL 70, goal < 70  Continue statin at discharge  Diabetes type II  HgbA1c 9.3, goal < 7.0  Uncontrolled  Other Stroke Risk Factors  Former Cigarette smoker  Former ETOH use  Overweight, Body mass index is 28.06 kg/m., recommend weight loss, diet and exercise as appropriate   Coronary artery disease w/ Stent 2003, 2005; TCA 2003, 2015  Hx carotid stenosis , no  significant stenosis seen this admission   Other Active Problems  GIB, duodenal ulcers seen on EGD w/o bleeding, s/p GDA embolization at [facility] in 2016.  Planned outpatient GI follow-up with repeat EGD in 6 weeks  SSS w/ pacer  Hospital day # 4  Annie Main, MSN, APRN, ANVP-BC, AGPCNP-BC Advanced Practice Stroke Nurse [facility] See Amion for Schedule & Pager information 08/03/2018 9:19 AM   ATTENDING NOTE: I reviewed above note and agree with the assessment and plan. Pt was seen and examined.   63 year old male with history of CAD status post CABG in 2016, hypertension, sick sinus syndrome status post pacemaker, carotid stenosis status post right carotid stent in 2016 admitted for syncope in the setting of upper GI bleeding along with left facial droop and left hand clumsiness.  Hemoglobin 7.3 on admission required blood transfusion.  GI involved and EGD showed nonbleeding  duodenum ulcer with history of GDA embolization in 2016.  CT head showed right BG, left ACA chronic infarct.  CT repeat showed acute right SO infarct.  CTA head and neck showed high-grade tandem stenoses cavernous R ICA and proximal R M1 segment c/w moyamoya pattern of collateral. R ICA stent is patent. Diffuse atherosclerotic changes.  EF 55 to 60%.  LDL 70 and A1c 9.3.  Pacemaker interrogation showed no A. Fib.  On examination, patient neurologic intact, left hand no significant sent weakness although patient still feel not back to his normal.  Patient stroke most likely due to diffuse atherosclerosis especially right tendon high-grade stenosis at right terminal ICA and right M1.  Recommend aspirin 81 and Plavix 75 for 3 months and then Plavix alone if okay from GI standpoint.  Continue Zocor for stroke prevention.   Neurology will sign off. Please call with questions. Pt will follow up with stroke clinic NP at [facility] in about 4 weeks. Thanks for the consult.  Marvel Plan, MD PhD Stroke Neurology 08/03/2018 7:04 PM     To contact Stroke Continuity provider, please refer to [email or web address]. After hours, contact General Neurology

## 2018-08-07 LAB — CULTURE, BLOOD (ROUTINE X 2)
CULTURE: NO GROWTH
Culture: NO GROWTH
Special Requests: ADEQUATE
Special Requests: ADEQUATE

## 2019-07-13 ENCOUNTER — Inpatient Hospital Stay
Admission: AD | Admit: 2019-07-13 | Payer: BLUE CROSS/BLUE SHIELD | Source: Other Acute Inpatient Hospital | Admitting: Internal Medicine

## 2020-08-26 IMAGING — CT CT ANGIO HEAD
1 of 8 series · 6 of 33 positions shown · IV contrast (OMNI 350)
Comparison: CT head without contrast 08/02/18, 07/30/2018, 07/29/18

CLINICAL DATA: Stroke, follow-up. Bilateral frontal lobe
subcortical infarcts.

EXAM:
CT ANGIOGRAPHY HEAD AND NECK
TECHNIQUE: Multidetector CT imaging of the head and neck was performed using
the standard protocol during bolus administration of intravenous
contrast. Multiplanar CT image reconstructions and MIPs were
obtained to evaluate the vascular anatomy. Carotid stenosis
measurements (when applicable) are obtained utilizing NASCET
criteria, using the distal internal carotid diameter as the
denominator.
CONTRAST:  75mL [id number] IOPAMIDOL ([id number]) INJECTION 76%

[Series 8: cta neck axial · axial · 0.59mm/px · z∈[-330,-40]mm · 6 of 408 slices shown]
[im 59/408  soft-tissue]
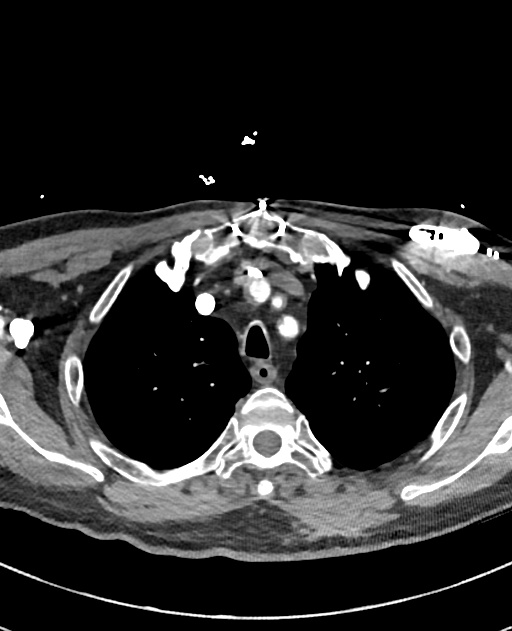
[im 117/408  bone]
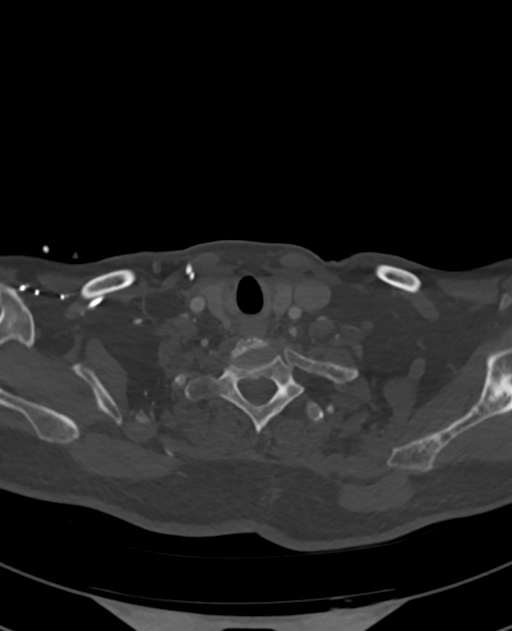
[im 175/408  soft-tissue]
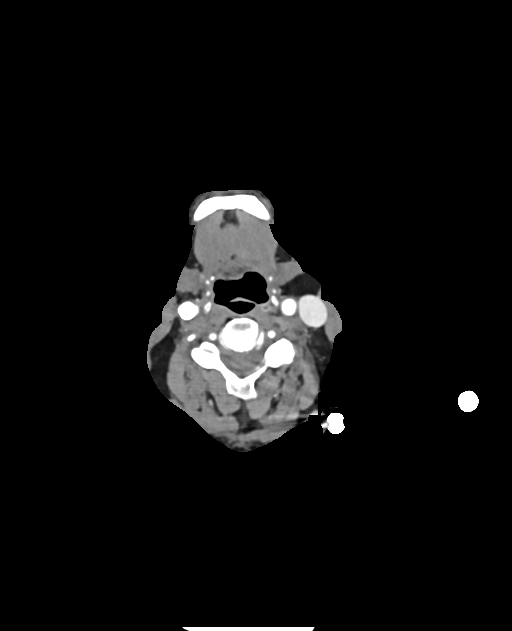
[im 233/408  bone]
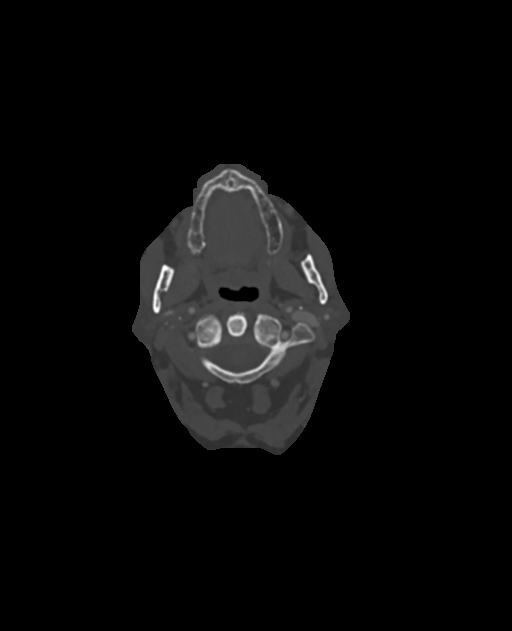
[im 291/408  soft-tissue]
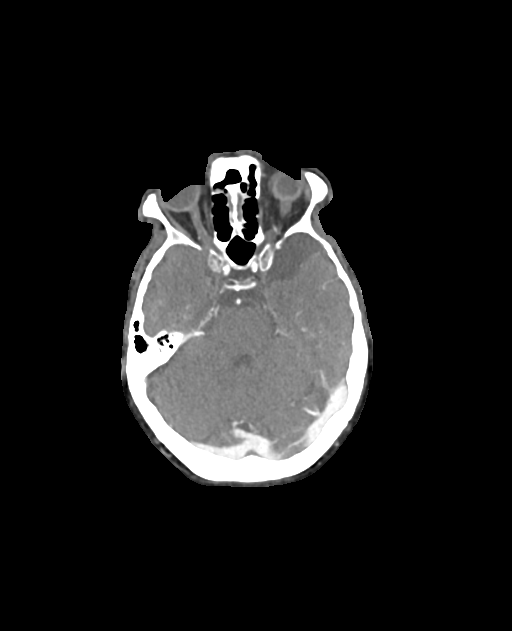
[im 349/408  bone]
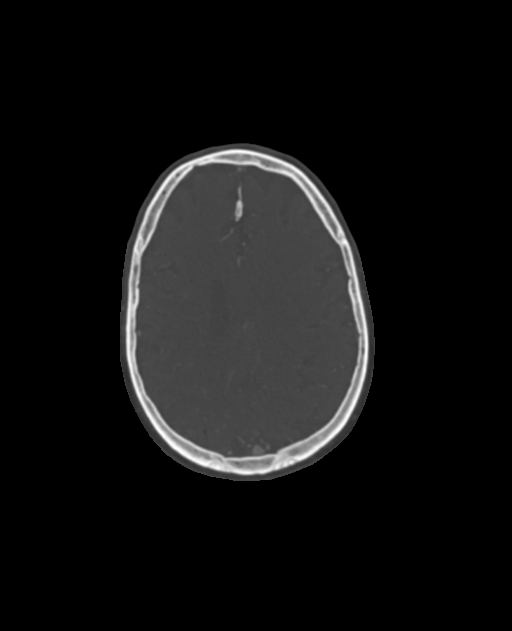

[6 of 33 positions shown; findings below may reference images not displayed]

FINDINGS: CTA NECK FINDINGS

Aortic arch: A 3 vessel arch configuration is present.
Atherosclerotic changes are present at the arch without a
significant stenosis of the great vessel origins.

Right carotid system: The right common carotid artery is within
normal limits. A right carotid stent is in place. The lumen is
patent through the stent. The distal right ICA demonstrates some
tortuosity without significant stenosis.

Left carotid system: The left common carotid artery demonstrates
noncalcified plaque proximally without a significant stenosis
relative to the more distal vessel. The more distal left common
carotid artery is within normal limits. Calcified and noncalcified
plaque present in the proximal left internal carotid artery. The
lumen is narrowed 2 mm. Atherosclerotic irregularity is present in
the cervical left ICA throughout its course. Minimal luminal
diameter is just below the skull base, with the lumen is narrowed to
1.5 mm.

Vertebral arteries: The vertebral arteries are codominant. Both
vertebral arteries originate from the subclavian arteries without
significant stenosis. There is moderate narrowing of the right
vertebral artery at the C1-2 junction relative to the more distal
vessel. No significant stenosis is present on the left.

Skeleton: Solid fusion is present at C6-7. Slight degenerative
anterolisthesis is present at C3-4. Vertebral body heights are
otherwise maintained. Median sternotomy is noted.

Other neck: Soft tissues the neck are otherwise unremarkable.
Thyroid is within normal limits. Significant adenopathy is present.
Salivary glands are within normal limits.

Upper chest: There is mild thickening of the scratched at there is
mild proximal thickening of the esophagus. No discrete lesion is
present. Upper mediastinum is within normal limits. The lung apices
are clear.

Review of the MIP images confirms the above findings

CTA HEAD FINDINGS

Anterior circulation: Atherosclerotic calcifications are present
throughout the precavernous and cavernous internal carotid artery.
There is a high-grade stenosis in the proximal cavernous right ICA.
The distal right ICA terminus is opacified. Moderate stenosis is
present in the petrous left ICA. Calcifications are present
throughout the cavernous internal carotid artery without additional
stenosis. ICA termini are intact bilaterally. There is mild
narrowing of the proximal left M1 segment. The right A1 segment is
dominant. The anterior communicating artery is patent. There is a
high-grade stenosis proximal right M1 segment multiple small vessels
are consistent with a developing moyamoya. Moderate diffuse branch
vessel disease is present. Collaterals are present. ACA branch
vessels are within normal limits bilaterally. More mild small vessel
irregularity is present in the left MCA branch vessels.

Posterior circulation: A moderate stenosis is present in the
proximal left V3 segment with dense calcifications at the dural
margin. PICA origins are visualized and normal bilaterally. The
vertebrobasilar junction is normal. Basilar artery is within normal
limits. Both posterior cerebral arteries originate from the basilar
tip. There is moderate small vessel disease in the PCA branches
without a significant proximal stenosis otherwise.

Venous sinuses: The dural sinuses are patent. Straight sinus and
deep cerebral veins are intact. Cortical veins are within normal
limits bilaterally. The left transverse sinus is dominant.

Anatomic variants: None

Delayed phase: Postcontrast images accentuate the frontal lobe
infarcts. No pathologic enhancement is present.

Review of the MIP images confirms the above findings
IMPRESSION: 1. High-grade tandem stenoses involving the proximal cavernous right
internal carotid artery in the proximal right M1 segment.
2. Contrast blush at the right ICA terminus and proximal right M1
segment consistent with developing moyamoya.
3. Asymmetric decreased size of right MCA branch vessels suggesting
decreased perfusion. Jim is a are likely fed via collaterals.
4. Right ICA stent is patent. Ultrasound would be more accurate at
identifying any recurrent stenosis given the metal artifact.
5. Diffuse atherosclerotic changes from the left carotid bifurcation
through the skull base in the left internal carotid artery. The
distal lumen is narrowed to less than 1.5 mm.
6. Moderate stenosis in the petrous left ICA.
7. Atherosclerotic changes in the left M1 segment without a
significant stenosis.
8. Diffuse medium and small vessel disease within the ACA branch
vessels bilaterally and left MCA branch vessels.
9. Moderate diffuse medium and small vessel disease in the posterior
circulation.
10. Moderate left V3 segment vertebral artery stenosis.
11. Atherosclerotic changes at the aortic arch without significant
stenosis.
12. Degenerative changes of the cervical spine as described.
13. Bilateral frontal lobe infarcts.

## 2020-10-16 ENCOUNTER — Ambulatory Visit: Payer: Medicare Other | Admitting: Podiatry

## 2022-01-30 DEATH — deceased
